# Patient Record
Sex: Female | Born: 2000
Health system: Southern US, Community
[De-identification: ages and names within clinical notes are randomized; demographics above are authoritative.]

## PROBLEM LIST (undated history)

## (undated) DIAGNOSIS — G40909 Epilepsy, unspecified, not intractable, without status epilepticus: Secondary | ICD-10-CM

## (undated) DIAGNOSIS — O169 Unspecified maternal hypertension, unspecified trimester: Secondary | ICD-10-CM

## (undated) HISTORY — PX: HAND SURGERY: SHX662

---
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
## 2018-06-13 DIAGNOSIS — H6691 Otitis media, unspecified, right ear: Secondary | ICD-10-CM | POA: Diagnosis not present

## 2018-07-01 DIAGNOSIS — Z3491 Encounter for supervision of normal pregnancy, unspecified, first trimester: Secondary | ICD-10-CM | POA: Diagnosis not present

## 2018-07-01 DIAGNOSIS — Z3490 Encounter for supervision of normal pregnancy, unspecified, unspecified trimester: Secondary | ICD-10-CM | POA: Diagnosis not present

## 2018-07-18 DIAGNOSIS — S0081XA Abrasion of other part of head, initial encounter: Secondary | ICD-10-CM | POA: Diagnosis not present

## 2018-07-18 DIAGNOSIS — S161XXA Strain of muscle, fascia and tendon at neck level, initial encounter: Secondary | ICD-10-CM | POA: Diagnosis not present

## 2018-08-11 DIAGNOSIS — Z3009 Encounter for other general counseling and advice on contraception: Secondary | ICD-10-CM | POA: Diagnosis not present

## 2018-08-11 DIAGNOSIS — Z1388 Encounter for screening for disorder due to exposure to contaminants: Secondary | ICD-10-CM | POA: Diagnosis not present

## 2018-08-11 DIAGNOSIS — Z36 Encounter for antenatal screening for chromosomal anomalies: Secondary | ICD-10-CM | POA: Diagnosis not present

## 2018-08-11 DIAGNOSIS — Z0389 Encounter for observation for other suspected diseases and conditions ruled out: Secondary | ICD-10-CM | POA: Diagnosis not present

## 2018-09-05 DIAGNOSIS — O99212 Obesity complicating pregnancy, second trimester: Secondary | ICD-10-CM | POA: Diagnosis not present

## 2018-09-05 DIAGNOSIS — Z3492 Encounter for supervision of normal pregnancy, unspecified, second trimester: Secondary | ICD-10-CM | POA: Diagnosis not present

## 2018-09-05 DIAGNOSIS — Z36 Encounter for antenatal screening for chromosomal anomalies: Secondary | ICD-10-CM | POA: Diagnosis not present

## 2018-09-05 DIAGNOSIS — Z363 Encounter for antenatal screening for malformations: Secondary | ICD-10-CM | POA: Diagnosis not present

## 2018-09-05 DIAGNOSIS — Z3A19 19 weeks gestation of pregnancy: Secondary | ICD-10-CM | POA: Diagnosis not present

## 2018-09-05 DIAGNOSIS — Z3A18 18 weeks gestation of pregnancy: Secondary | ICD-10-CM | POA: Diagnosis not present

## 2018-10-10 DIAGNOSIS — R079 Chest pain, unspecified: Secondary | ICD-10-CM | POA: Diagnosis not present

## 2018-10-10 DIAGNOSIS — O26892 Other specified pregnancy related conditions, second trimester: Secondary | ICD-10-CM | POA: Diagnosis not present

## 2018-10-10 DIAGNOSIS — Z3A24 24 weeks gestation of pregnancy: Secondary | ICD-10-CM | POA: Diagnosis not present

## 2018-10-10 DIAGNOSIS — O9989 Other specified diseases and conditions complicating pregnancy, childbirth and the puerperium: Secondary | ICD-10-CM | POA: Diagnosis not present

## 2018-10-10 DIAGNOSIS — R072 Precordial pain: Secondary | ICD-10-CM | POA: Diagnosis not present

## 2018-10-10 DIAGNOSIS — R0789 Other chest pain: Secondary | ICD-10-CM | POA: Diagnosis not present

## 2018-10-25 DIAGNOSIS — Z362 Encounter for other antenatal screening follow-up: Secondary | ICD-10-CM | POA: Diagnosis not present

## 2018-10-25 DIAGNOSIS — Z3689 Encounter for other specified antenatal screening: Secondary | ICD-10-CM | POA: Diagnosis not present

## 2018-10-25 DIAGNOSIS — Z3A26 26 weeks gestation of pregnancy: Secondary | ICD-10-CM | POA: Diagnosis not present

## 2018-10-25 DIAGNOSIS — Z0374 Encounter for suspected problem with fetal growth ruled out: Secondary | ICD-10-CM | POA: Diagnosis not present

## 2018-10-25 DIAGNOSIS — O365920 Maternal care for other known or suspected poor fetal growth, second trimester, not applicable or unspecified: Secondary | ICD-10-CM | POA: Diagnosis not present

## 2018-11-22 DIAGNOSIS — Z20828 Contact with and (suspected) exposure to other viral communicable diseases: Secondary | ICD-10-CM | POA: Diagnosis not present

## 2018-12-15 DIAGNOSIS — Z3A33 33 weeks gestation of pregnancy: Secondary | ICD-10-CM | POA: Diagnosis not present

## 2018-12-15 DIAGNOSIS — Z113 Encounter for screening for infections with a predominantly sexual mode of transmission: Secondary | ICD-10-CM | POA: Diagnosis not present

## 2018-12-15 DIAGNOSIS — Z23 Encounter for immunization: Secondary | ICD-10-CM | POA: Diagnosis not present

## 2018-12-15 DIAGNOSIS — O26843 Uterine size-date discrepancy, third trimester: Secondary | ICD-10-CM | POA: Diagnosis not present

## 2019-01-04 DIAGNOSIS — O152 Eclampsia in the puerperium: Secondary | ICD-10-CM

## 2019-01-04 DIAGNOSIS — Z8759 Personal history of other complications of pregnancy, childbirth and the puerperium: Secondary | ICD-10-CM | POA: Insufficient documentation

## 2019-01-04 DIAGNOSIS — O1490 Unspecified pre-eclampsia, unspecified trimester: Secondary | ICD-10-CM

## 2019-01-04 HISTORY — DX: Eclampsia complicating the puerperium: O15.2

## 2019-01-04 HISTORY — DX: Unspecified pre-eclampsia, unspecified trimester: O14.90

## 2019-01-24 DIAGNOSIS — Z3403 Encounter for supervision of normal first pregnancy, third trimester: Secondary | ICD-10-CM | POA: Diagnosis not present

## 2019-01-25 DIAGNOSIS — Z3A39 39 weeks gestation of pregnancy: Secondary | ICD-10-CM | POA: Diagnosis not present

## 2019-01-25 DIAGNOSIS — O134 Gestational [pregnancy-induced] hypertension without significant proteinuria, complicating childbirth: Secondary | ICD-10-CM | POA: Diagnosis not present

## 2019-01-25 DIAGNOSIS — O152 Eclampsia in the puerperium: Secondary | ICD-10-CM

## 2019-01-25 DIAGNOSIS — O1414 Severe pre-eclampsia complicating childbirth: Secondary | ICD-10-CM | POA: Diagnosis not present

## 2019-01-25 DIAGNOSIS — O1490 Unspecified pre-eclampsia, unspecified trimester: Secondary | ICD-10-CM

## 2019-01-25 DIAGNOSIS — Z8619 Personal history of other infectious and parasitic diseases: Secondary | ICD-10-CM | POA: Diagnosis not present

## 2019-01-25 DIAGNOSIS — O1413 Severe pre-eclampsia, third trimester: Secondary | ICD-10-CM | POA: Diagnosis not present

## 2019-02-02 DIAGNOSIS — R569 Unspecified convulsions: Secondary | ICD-10-CM | POA: Diagnosis not present

## 2019-02-02 DIAGNOSIS — R0689 Other abnormalities of breathing: Secondary | ICD-10-CM | POA: Diagnosis not present

## 2019-02-02 DIAGNOSIS — R000 Tachycardia, unspecified: Secondary | ICD-10-CM | POA: Diagnosis not present

## 2019-02-02 DIAGNOSIS — R4182 Altered mental status, unspecified: Secondary | ICD-10-CM | POA: Diagnosis not present

## 2019-02-02 DIAGNOSIS — O152 Eclampsia in the puerperium: Secondary | ICD-10-CM | POA: Diagnosis not present

## 2019-02-02 DIAGNOSIS — R404 Transient alteration of awareness: Secondary | ICD-10-CM | POA: Diagnosis not present

## 2019-02-03 DIAGNOSIS — O159 Eclampsia, unspecified as to time period: Secondary | ICD-10-CM | POA: Diagnosis not present

## 2019-02-04 DIAGNOSIS — O152 Eclampsia in the puerperium: Secondary | ICD-10-CM | POA: Diagnosis not present

## 2019-03-28 DIAGNOSIS — M722 Plantar fascial fibromatosis: Secondary | ICD-10-CM | POA: Diagnosis not present

## 2019-07-07 NOTE — L&D Delivery Note (Signed)
OB/GYN Faculty Practice Delivery Note  Taylor Herring is a 19 y.o. G2P1001 s/p VD at [redacted]w[redacted]d. She was admitted for IOL for Pre-E.   ROM: 0h 9m with clear fluid GBS Status: Positive; PCN given   Maximum Maternal Temperature: 98.50F  Labor Progress: . Initial SVE: 3.5/50/-2. Patient received Pitocin. Received epidural. SROM occurred. She then progressed to complete.   Delivery Date/Time: 7/5 @ 2338 Delivery: Called to room and patient was complete and pushing. Head delivered in LOA position. No nuchal cord present. Shoulder and body delivered in usual fashion. Infant with spontaneous cry, placed on mother's abdomen, dried and stimulated. Cord clamped x 2 after 1-minute delay, and cut by FOB. Cord blood drawn. Placenta delivered spontaneously with gentle cord traction. Fundus firm with massage and Pitocin. Labia, perineum, vagina, and cervix inspected inspected with a peri-urethral laceration. Discussed repair with patient as area bleeding and patient declined at this time. Pressure applied with some improvement. Advised that should she continue to have bleeding, it will likely be necessary to repair and patient agreeable. IUD placed; see separate note.  Baby Weight: pending  Placenta: Sent to L&D Complications: None Lacerations: periurethral; not repaired-see above EBL: 221 mL Analgesia: Epidural   Infant:  APGAR (1 MIN): 9   APGAR (5 MINS): 9  APGAR (10 MINS):     Jerilynn Birkenhead, MD OB Family Medicine Fellow, [facility] for [facility], [facility] 01/09/2020, 12:07 AM

## 2019-08-12 DIAGNOSIS — H1013 Acute atopic conjunctivitis, bilateral: Secondary | ICD-10-CM | POA: Diagnosis not present

## 2019-08-12 DIAGNOSIS — H40033 Anatomical narrow angle, bilateral: Secondary | ICD-10-CM | POA: Diagnosis not present

## 2019-08-20 DIAGNOSIS — H5213 Myopia, bilateral: Secondary | ICD-10-CM | POA: Diagnosis not present

## 2019-09-11 DIAGNOSIS — H1013 Acute atopic conjunctivitis, bilateral: Secondary | ICD-10-CM | POA: Diagnosis not present

## 2019-10-02 DIAGNOSIS — R1110 Vomiting, unspecified: Secondary | ICD-10-CM | POA: Diagnosis not present

## 2019-11-20 ENCOUNTER — Other Ambulatory Visit: Payer: Self-pay

## 2019-11-20 ENCOUNTER — Emergency Department (HOSPITAL_COMMUNITY): Payer: Medicaid Other

## 2019-11-20 ENCOUNTER — Encounter (HOSPITAL_COMMUNITY): Payer: Self-pay

## 2019-11-20 DIAGNOSIS — Z3490 Encounter for supervision of normal pregnancy, unspecified, unspecified trimester: Secondary | ICD-10-CM | POA: Diagnosis not present

## 2019-11-20 DIAGNOSIS — R072 Precordial pain: Secondary | ICD-10-CM | POA: Insufficient documentation

## 2019-11-20 DIAGNOSIS — Z91040 Latex allergy status: Secondary | ICD-10-CM | POA: Insufficient documentation

## 2019-11-20 DIAGNOSIS — R102 Pelvic and perineal pain: Secondary | ICD-10-CM | POA: Diagnosis not present

## 2019-11-20 DIAGNOSIS — O99513 Diseases of the respiratory system complicating pregnancy, third trimester: Secondary | ICD-10-CM | POA: Diagnosis not present

## 2019-11-20 DIAGNOSIS — R0602 Shortness of breath: Secondary | ICD-10-CM | POA: Diagnosis not present

## 2019-11-20 LAB — CBC WITH DIFFERENTIAL/PLATELET
Abs Immature Granulocytes: 0.07 10*3/uL (ref 0.00–0.07)
Basophils Absolute: 0 10*3/uL (ref 0.0–0.1)
Basophils Relative: 0 %
Eosinophils Absolute: 0.1 10*3/uL (ref 0.0–0.5)
Eosinophils Relative: 1 %
HCT: 33.4 % — ABNORMAL LOW (ref 36.0–46.0)
Hemoglobin: 9.6 g/dL — ABNORMAL LOW (ref 12.0–15.0)
Immature Granulocytes: 1 %
Lymphocytes Relative: 18 %
Lymphs Abs: 1.9 10*3/uL (ref 0.7–4.0)
MCH: 21 pg — ABNORMAL LOW (ref 26.0–34.0)
MCHC: 28.7 g/dL — ABNORMAL LOW (ref 30.0–36.0)
MCV: 72.9 fL — ABNORMAL LOW (ref 80.0–100.0)
Monocytes Absolute: 1 10*3/uL (ref 0.1–1.0)
Monocytes Relative: 9 %
Neutro Abs: 7.7 10*3/uL (ref 1.7–7.7)
Neutrophils Relative %: 71 %
Platelets: 339 10*3/uL (ref 150–400)
RBC: 4.58 MIL/uL (ref 3.87–5.11)
RDW: 16.5 % — ABNORMAL HIGH (ref 11.5–15.5)
WBC: 10.8 10*3/uL — ABNORMAL HIGH (ref 4.0–10.5)
nRBC: 0 % (ref 0.0–0.2)

## 2019-11-20 LAB — COMPREHENSIVE METABOLIC PANEL
ALT: 11 U/L (ref 0–44)
AST: 16 U/L (ref 15–41)
Albumin: 3.3 g/dL — ABNORMAL LOW (ref 3.5–5.0)
Alkaline Phosphatase: 106 U/L (ref 38–126)
Anion gap: 10 (ref 5–15)
BUN: 7 mg/dL (ref 6–20)
CO2: 21 mmol/L — ABNORMAL LOW (ref 22–32)
Calcium: 8.7 mg/dL — ABNORMAL LOW (ref 8.9–10.3)
Chloride: 104 mmol/L (ref 98–111)
Creatinine, Ser: 0.5 mg/dL (ref 0.44–1.00)
GFR calc Af Amer: >60 mL/min (ref >60)
GFR calc non Af Amer: >60 mL/min (ref >60)
Glucose, Bld: 83 mg/dL (ref 70–99)
Potassium: 4 mmol/L (ref 3.5–5.1)
Sodium: 135 mmol/L (ref 135–145)
Total Bilirubin: 0.7 mg/dL (ref 0.3–1.2)
Total Protein: 7.9 g/dL (ref 6.5–8.1)

## 2019-11-20 LAB — I-STAT BETA HCG BLOOD, ED (NOT ORDERABLE): I-stat hCG, quantitative: >2000 m[IU]/mL — ABNORMAL HIGH (ref <5)

## 2019-11-20 NOTE — ED Triage Notes (Signed)
Pt sts she is here for chest tightness and pressure while at work making her feel dizzy and nauseated. Also c/o right sided pelvic pain. Not sure how far along she is but know estimated due date 01/19/20.

## 2019-11-20 NOTE — ED Notes (Signed)
Also drew a long gold top that is in the lab

## 2019-11-21 ENCOUNTER — Emergency Department (HOSPITAL_COMMUNITY)
Admission: EM | Admit: 2019-11-21 | Discharge: 2019-11-21 | Disposition: A | Discharge status: Home / Self Care | Payer: Medicaid Other | Attending: Emergency Medicine | Admitting: Emergency Medicine

## 2019-11-21 DIAGNOSIS — R072 Precordial pain: Secondary | ICD-10-CM

## 2019-11-21 DIAGNOSIS — Z3490 Encounter for supervision of normal pregnancy, unspecified, unspecified trimester: Secondary | ICD-10-CM

## 2019-11-21 DIAGNOSIS — R0602 Shortness of breath: Secondary | ICD-10-CM

## 2019-11-21 LAB — TROPONIN I (HIGH SENSITIVITY): Troponin I (High Sensitivity): <2 ng/L (ref <18)

## 2019-11-21 NOTE — ED Notes (Signed)
Ambulated patient 1x lap around ED, patient also talked during ambulation and sats maintained greater than 93%.

## 2019-11-21 NOTE — Discharge Instructions (Signed)

## 2019-11-21 NOTE — ED Provider Notes (Signed)
[facility]-EMERGENCY DEPT Provider Note   CSN: [id number] Arrival date & time: 11/20/19  1958     History Chief Complaint  Patient presents with  . Shortness of Breath  . Pelvic Pain    Taylor Herring is a 19 y.o. female.   Shortness of Breath Severity:  Moderate Onset quality:  Sudden Duration:  2 hours Timing:  Constant Progression:  Improving Relieved by:  None tried Worsened by:  Nothing Associated symptoms: chest pain   Associated symptoms: no abdominal pain, no cough, no fever, no hemoptysis and no syncope   Associated symptoms comment:  Chest tightness Risk factors: no hx of PE/DVT and no tobacco use   Pelvic Pain Associated symptoms include chest pain and shortness of breath. Pertinent negatives include no abdominal pain.  Patient is currently [redacted] weeks pregnant.  She reports earlier tonight she had an episode of chest tightness and shortness of breath.  She reported mild nausea and dizziness that is now resolved.  No fevers or vomiting.  No hemoptysis.  No cough.  No new lower extremity edema.  This occurred at work.  She does not think she was anxious at the time.  Patient reports she has been seen previously for her pregnancy but has been quite some time.  She does report she had an ultrasound that revealed a normal pregnancy.  No acute complications thus far.  Previous history of preeclampsia from her first pregnancy She is a non-smoker.  No drug abuse   Patient reports she has had pelvic pain for over a month.  It is mostly positional.  She is pain-free at this time.  No vaginal bleeding.  She reports fetal movement.  No trauma    Past Medical History:  Diagnosis Date  . Hypertension affecting pregnancy   . Seizure disorder during pregnancy in third trimester [facility])    r/t preeclampsia    There are no problems to display for this patient.   Past Surgical History:  Procedure Laterality Date  . HAND SURGERY       OB  History    Gravida  1   Para      Term      Preterm      AB      Living        SAB      TAB      Ectopic      Multiple      Live Births              No family history on file.  Social History   Tobacco Use  . Smoking status: Never Smoker  . Smokeless tobacco: Never Used  Substance Use Topics  . Alcohol use: Not Currently  . Drug use: Not Currently    Home Medications Prior to Admission medications   Not on File    Allergies    Latex  Review of Systems   Review of Systems  Constitutional: Negative for fever.  Respiratory: Positive for shortness of breath. Negative for cough and hemoptysis.   Cardiovascular: Positive for chest pain. Negative for leg swelling and syncope.  Gastrointestinal: Negative for abdominal pain.  Genitourinary: Positive for pelvic pain. Negative for vaginal bleeding.  All other systems reviewed and are negative.   Physical Exam Updated Vital Signs BP 123/75   Pulse 86   Temp 98.4 F (36.9 C) (Oral)   Resp 20   Ht 1.791 m (5' 10.5")   Wt 104.3 kg  SpO2 97%   BMI 32.54 kg/m   Physical Exam CONSTITUTIONAL: Well developed/well nourished HEAD: Normocephalic/atraumatic EYES: EOMI/PERRL ENMT: Mucous membranes moist NECK: supple no meningeal signs SPINE/BACK:entire spine nontender CV: S1/S2 noted, no murmurs/rubs/gallops noted LUNGS: Lungs are clear to auscultation bilaterally, no apparent distress ABDOMEN: soft, nontender, no rebound or guarding, bowel sounds noted throughout abdomen, gravid GU:no cva tenderness NEURO: Pt is awake/alert/appropriate, moves all extremitiesx4.  No facial droop.  EXTREMITIES: pulses normal/equal, full ROM, no calf tenderness, no lower extremity SKIN: warm, color normal PSYCH: no abnormalities of mood noted, alert and oriented to situation  ED Results / Procedures / Treatments   Labs (all labs ordered are listed, but only abnormal results are displayed) Labs Reviewed  CBC WITH  DIFFERENTIAL/PLATELET - Abnormal; Notable for the following components:      Result Value   WBC 10.8 (*)    Hemoglobin 9.6 (*)    HCT 33.4 (*)    MCV 72.9 (*)    MCH 21.0 (*)    MCHC 28.7 (*)    RDW 16.5 (*)    All other components within normal limits  COMPREHENSIVE METABOLIC PANEL - Abnormal; Notable for the following components:   CO2 21 (*)    Calcium 8.7 (*)    Albumin 3.3 (*)    All other components within normal limits  I-STAT BETA HCG BLOOD, ED (NOT ORDERABLE) - Abnormal; Notable for the following components:   I-stat hCG, quantitative >2,000.0 (*)    All other components within normal limits  TROPONIN I (HIGH SENSITIVITY)    EKG EKG Interpretation  Date/Time:  Monday Nov 20 2019 20:39:53 EDT Ventricular Rate:  91 PR Interval:    QRS Duration: 89 QT Interval:  350 QTC Calculation: 431 R Axis:   61 Text Interpretation: Sinus rhythm Borderline T wave abnormalities Baseline wander in lead(s) II III aVF No previous ECGs available Confirmed by Ripley Fraise [phone]) on 11/21/2019 1:21:35 AM   Radiology DG Chest 2 View  Result Date: 11/20/2019 CLINICAL DATA:  Shortness of breath EXAM: CHEST - 2 VIEW COMPARISON:  None. FINDINGS: The heart size and mediastinal contours are within normal limits. Both lungs are clear. The visualized skeletal structures are unremarkable. IMPRESSION: No acute abnormality of the lungs. Electronically Signed   By: Eddie Candle M.D.   On: 11/20/2019 20:31    Procedures Procedures    Medications Ordered in ED Medications - No data to display  ED Course  I have reviewed the triage vital signs and the nursing notes.  Pertinent labs & imaging results that were available during my care of the patient were reviewed by me and considered in my medical decision making (see chart for details).    MDM Rules/Calculators/A&P                       This patient presents to the ED for concern of shortness of breath and chest pain, this involves an  extensive number of treatment options, and is a complaint that carries with it a high risk of complications and morbidity.  The differential diagnosis includes ACS, pulmonary lesion, CHF, pneumonia   Lab Tests:   I Ordered, reviewed, and interpreted labs, which included electrolytes, complete blood count, troponin    Imaging Studies ordered:   I ordered imaging studies which included chest x-ray which was ordered prior to my evaluation   I independently visualized and interpreted imaging which showed no acute findings   After the interventions stated  above, I reevaluated the patient and found patient is improved  Patient very well-appearing.  All of her symptoms are resolved.  No acute EKG changes.  Chest x-ray was negative.  Low suspicion for ACS/PE/dissection. No hypoxia or tachycardia upon ambulation which makes PE less likely.  In terms of her pelvic pain, she reports this has been chronic for over a month and is positional.  No pain at this time.  No bleeding.  She reports good fetal movement Advised follow-up with OB/GYN.  She is already had ultrasound imaging previously. Final Clinical Impression(s) / ED Diagnoses Final diagnoses:  Pregnancy, unspecified gestational age  Precordial pain  Shortness of breath    Rx / DC Orders ED Discharge Orders    None       Zadie Rhine, MD 11/21/19 0301

## 2019-11-26 DIAGNOSIS — T189XXA Foreign body of alimentary tract, part unspecified, initial encounter: Secondary | ICD-10-CM | POA: Diagnosis not present

## 2019-11-30 DIAGNOSIS — H16223 Keratoconjunctivitis sicca, not specified as Sjogren's, bilateral: Secondary | ICD-10-CM | POA: Diagnosis not present

## 2019-12-12 ENCOUNTER — Other Ambulatory Visit: Payer: Self-pay

## 2019-12-12 ENCOUNTER — Ambulatory Visit (INDEPENDENT_AMBULATORY_CARE_PROVIDER_SITE_OTHER): Payer: Medicaid Other | Admitting: Student

## 2019-12-12 ENCOUNTER — Encounter: Payer: Self-pay | Admitting: Student

## 2019-12-12 VITALS — BP 110/81 | HR 85 | Wt 247.0 lb

## 2019-12-12 DIAGNOSIS — O0993 Supervision of high risk pregnancy, unspecified, third trimester: Secondary | ICD-10-CM | POA: Diagnosis not present

## 2019-12-12 DIAGNOSIS — O152 Eclampsia in the puerperium: Secondary | ICD-10-CM | POA: Diagnosis not present

## 2019-12-12 DIAGNOSIS — Z23 Encounter for immunization: Secondary | ICD-10-CM

## 2019-12-12 DIAGNOSIS — O0933 Supervision of pregnancy with insufficient antenatal care, third trimester: Secondary | ICD-10-CM

## 2019-12-12 DIAGNOSIS — O0990 Supervision of high risk pregnancy, unspecified, unspecified trimester: Secondary | ICD-10-CM

## 2019-12-12 DIAGNOSIS — O99013 Anemia complicating pregnancy, third trimester: Secondary | ICD-10-CM

## 2019-12-12 DIAGNOSIS — O2343 Unspecified infection of urinary tract in pregnancy, third trimester: Secondary | ICD-10-CM

## 2019-12-12 DIAGNOSIS — Z8759 Personal history of other complications of pregnancy, childbirth and the puerperium: Secondary | ICD-10-CM

## 2019-12-12 DIAGNOSIS — Z3A34 34 weeks gestation of pregnancy: Secondary | ICD-10-CM | POA: Diagnosis not present

## 2019-12-12 NOTE — Progress Notes (Signed)
Gave BP Cuff today because she is unsure of going to get one or when she can. Advised on how to use it.

## 2019-12-12 NOTE — Patient Instructions (Addendum)
AREA PEDIATRIC/FAMILY PRACTICE PHYSICIANS  [place] ([address]) . [facility] o Chambliss, MD; Eniola, MD; Hale, MD; Hensel, MD; McDiarmid, MD; McIntyer, MD; Neal, MD; Walden, MD o [address] [place], [place] [address] o [phone] o Mon-Fri 8:30-12:30, 1:30-5:00 o Providers come to see babies at [facility] o Accepting Medicaid . [facility] o Limited providers who accept newborns: Koirala, MD; Morrow, MD; Wolters, MD o [address], [place], [place] [address] o [phone] o Mon-Fri 8:00-5:30 o Babies seen by providers at [facility] o Does NOT accept Medicaid o Please call early in hospitalization for appointment (limited availability)  . [facility] o Mulberry, MD o [address] [place], [place] [address] o [phone] o Mon, Tue, Thur, Fri 8:30-5:00, Wed 10:00-7:00 (closed 1-2pm) o Babies seen by [facility] providers o Accepting Medicaid . [facility] - Pediatrician o Rubin, MD o [address], [place], [place] [address] o [phone] o Mon-Fri 8:30-5:00, Sat 8:30-12:00 o Provider comes to see babies at [facility] o Accepting Medicaid o Must have been referred from current patients or contacted office prior to delivery . [facility] ([facility]) o Brown, MD; Chandler, MD; Ettefagh, MD; Grant, MD; Lester, MD; McCormick, MD; McQueen, MD; Prose, MD; Simha, MD; Stanley, MD; Stryffeler, NP; Tebben, NP o [address], [place], [place] [address] o [phone] o Mon, Tue, Thur, Fri 8:30-5:30, Wed 9:30-5:30, Sat 8:30-12:30 o Babies seen by [facility] providers o Accepting Medicaid o Only accepting infants of first-time parents or siblings of current patients o Hospital discharge coordinator will make follow-up appointment . Jack Amos o [address],  [place], [place]  [address] o [phone]   Fax - [phone] . [facility] o [address], [place], [place]  [address] o Phone - [phone]   Fax - [phone] . Shilpa Gosrani o [address], [place], [place]  [address] o [phone]  [place] ([address]) . [facility] o Bates, MD; [place], MD; Cooper, Cox, MD; MD; Davis, MD; Dovico, MD; Ettefaugh, MD; Little, MD; Lowe, MD; Keiffer, MD; Melvin, MD; Sumner, MD; Williams, MD o [address], [place], [place] [address] o [phone] o Mon-Fri 8:30-5:00 (extended evenings Mon-Thur as needed), Sat-Sun 10:00-1:00 o Providers come to see babies at [facility] o Accepting Medicaid for families of first-time babies and families with all children in the household age 3 and under. Must register with office prior to making appointment (M-F only). . [facility] o Henson, NP; Knapp, MD; Lalonde, MD; Tysinger, PA o [address] [place], [place] [address] o [phone] o Mon-Fri 8:00-5:00 o Babies seen by providers at [facility] o Does NOT accept Medicaid/Commercial Insurance Only . [facility] [place] ([facility]  o Artis, MD; Barnes, MD; Bratton, MD; Coccaro, MD; Lockett Gardner, MD; Kramer, MD; Marshall, MD; Netherton, MD; Poleto, MD; Skinner, MD o [address] [place], [place] [address] o [phone] o Mon-Fri 8:30-5:30, Sat (Oct.-Mar.) 9:00-1:00 o Babies seen by providers at [facility] o Accepting Medicaid  [place] ([address]) . [facility] o Reid, MD; Warner, MD o [address], [place],  [address] o [phone] o Mon-Fri 8:30-5:00, Sat 8:30-12:00 o Providers come to see babies at [facility] o Does NOT accept Medicaid . [facility] o Becker, PA; Hagler, MD; Scifres, PA; Sun, MD; Swayne, MD o [address],  [place], [place] [address] o [phone] o Mon-Fri 8:00-5:00 o Babies seen by providers at [facility] o Does NOT accept Medicaid o Only accepting babies of parents who are patients o Please call early in hospitalization for appointment (limited availability) . [facility] o Clark, MD; Frye, MD; Kelleher, MD; Mack, NP; Miller, MD; O'Keller, MD; Patterson, NP; Pudlo, MD; Puzio, MD; Thomas, MD; Tucker, MD; Twiselton, MD o [address], [place], [place] [address] o [phone] o Mon-Fri 8:00-5:00, Sat 9:00-12:00 o Providers come to see babies at [facility] o Does NOT accept Medicaid  [place] ([address]) . [facility] o Limited providers accepting new patients: Brake, NP; Wharton, PA o [address], [place], [place] [address] o [phone] o Mon-Fri 8:00-5:00 o Babies seen by providers at [facility] o Does NOT accept Medicaid o Only accepting babies of parents who are patients o Please call early in hospitalization for appointment (limited availability) . [facility] o Gay, MD; Quinlan, MD o [address] [place], [place] [address] o [phone] (press 1 to schedule appointment) o Mon-Fri 8:00-5:00 o Providers come to see babies at [facility] o Does NOT accept Medicaid . [facility] o Mazer, MD o [address] [place], [place] [address] o [phone] o Mon-Fri 8:30-5:00 (lunch 12:30-1:00), extended hours by appointment only Wed 5:00-6:30 o Babies seen by [facility] providers o Accepting Medicaid . [facility] o Banks, MD; Jordan, MD; Koberlein, MD o [address], [place], [place] [address] o [phone] o Mon-Fri 8:00-5:00 o Babies seen by [facility] providers o Does NOT accept Medicaid . [facility] o Parker, MD; Hunter, MD; Wallace, DO o [address] [place], [place]  [address] o [phone] o Mon-Fri 8:00-5:00 o Babies seen by [facility] providers o Does NOT accept Medicaid . [facility] o Brandon, PA; Brecken, PA; Christy, NP; Dees, MD; DeClaire, MD; DeWeese, MD; Hansen, NP; Mills, NP; Parrish, NP; Smoot, NP; Summer, MD; Vapne, MD o [address] [place], [place] [address] o [phone] o Mon-Fri 8:30-5:00, Sat 10:00-1:00 o Providers come to see babies at [facility] o Does NOT accept Medicaid o Free prenatal information session Tuesdays at 4:45pm . [facility] o Bouska, MD; Gordon, PA; Jeffery, PA; Weber, PA o [address] [place] [address] o [phone] o Mon-Fri 7:30-5:30 o Babies seen by [facility] providers . [facility] o [address], [place], [place], [place]  [address] o [phone]   Fax - [phone]  [place] ([phone]) . [facility] o [place]ese, [place] o [address] [place], [place] [address] o [phone] o Mon-Thur 8:00-6:00 o Providers come to see babies at [facility] o Accepting Medicaid . [facility] o Anderson, NP; Badger, MD; Beal, PA; Spencer, PA o [address] [place],  [address] o [phone] o Mon-Thur 7:30-7:30, Fri 7:30-4:30 o Babies seen by [facility] providers o Accepting Medicaid . [facility] o Agbuya, MD; Klett, NP; Romgoolam, MD o [address], ,  [address] o [phone] o Mon-Fri 8:30-5:00, Sat 8:30-12:00 o Providers come to see babies at [facility] o Accepting Medicaid o Must have "Meet & Greet" appointment at office prior to delivery . [facility]  ([facility] ) o McCord,   MD; Juleen China, MD; Clydene Laming, [address], [place], [place] [address] o [phone] o Mon-Wed 8:00-6:00, Thur-Fri 8:00-5:00, Sat 9:00-12:00 o Providers come to  see babies at [facility] o Does NOT accept Medicaid o Only accepting siblings of current patients . [facility] [place], [place], [place], [place]  [address] o [phone]   Fax [phone] . [facility] [place] [address], [place], [place]  [address] o [phone]   Fax - Morton Gorman [phone] & [phone]) . Therapist, music at [place], DO; [place], [address] [place], [place] [address] o [phone] o Mon-Fri 7:00-5:00 o Babies seen by [facility] providers o Does NOT accept Medicaid . [place], MD; [place], [place]; [place], [place], [place], [place] [address] o [phone] o Mon-Fri 8:00-5:00 o Babies seen by [facility] providers o Accepting Medicaid . Lamont, MD; [place], [place]; [place], NP; [place], [place], [place], [place] [address] o [phone] o Mon-Fri 8:00-5:00 o Babies seen by providers at [facility] [place] [phone]) . [facility] [place], [place] o [address] [place], [place] [address] o [phone] o Mon-Fri 8:00-5:00 o Babies seen by [facility] providers o Does NOT accept Medicaid o Limited availability, please call early in hospitalization to schedule follow-up . [facility] Leilani Merl, PA; Maisie Fus, MD; [place], MD; [place], [place]; Jeannine Kitten, MD; [place], [place] [facility] [address], [place], [place] [address] o [phone] o Mon-Fri 8:30-5:00, Sat 9:00-12:00 o Babies seen by providers at [facility] o Accepting Medicaid o Please register online then schedule online or call office o [email or web address] . [place] [facility]  [place]) Kristian Covey, NP; Dwyane Dee, MD; Leonidas Romberg, PA o [address] [place], [place], [place] [address] o [phone] o Mon-Fri 8:00-5:00 o Babies seen by providers at [facility] o Accepting Medicaid . [place] [facility] [employer]) Dairl Ponder, MD; Rayvon Char, NP; Melina Modena, MD o [address] [place], [place], [place] [address] o [phone] o Mon-Fri 8:00-5:30, Sat&Sun by appointment (phones open at 8:30) o Babies seen by [facility] providers o Accepting Medicaid o Must be a first-time baby or sibling of current patient . [place], [address], [place], [place]  [address] o [phone]   Fax - [phone]  [place] [phone] & [phone]) . [place], [place]; [place], [place]; Benjamine Mola, MD; [place], [place]; Harrell Lark, MD o [address] [place], [place] [address] o [phone] o Mon-Thur 8:00-7:00, Fri 8:00-5:00, Sat 8:00-12:00, Sun 9:00-12:00 o Babies seen by [facility] providers o Accepting Medicaid . [facility] at [facility], MD; Ruthann Cancer, MD; [facility], MD o [address], [place], [place] [address] o [phone] o Mon-Thur 8:00-5:00 o Babies seen by providers at [facility] o Accepting Medicaid . [facility] at [place], MD; Coe[place], MD; Amedeo Plenty, MD; Bobby Rumpf, MD; List, MD; Lavonia Drafts, MD; Ruthann Cancer, MD; Selinda Eon, MD; Audie Box, MD; Jim Like, MD; Christie Nottingham, MD; Hubbard Hartshorn, MD; Modena Nunnery, MD o [address] [place], [place]  [address] o [phone] o Mon-Fri 8:00-5:30, Sat (Oct.-Mar.) 9:00-1:00 o Babies seen by providers at [facility] o Accepting Medicaid o Must fill out new patient packet, available online at [email or web address] . [facility] [place] [facility] at [facility]) Simone Curia, NP; Tiburcio Pea, NP; Tresa Endo, NP; Whitney Post, MD;  [place], [place]; Hennie Duos, MD; Wynne Dust, MD; Kavin Leech, NP o [address], [place], [place] [address] o [phone] o Mon-Thur 8:00-5:30, Fri 8:00-5:00 o Babies seen by providers at [facility] o Accepting [facility] [phone]) . [facility] o [place], [place]; [place], MD; Tanya Nones, MD; [place], [place] o [address] [place], [place] [address] o [phone] o Mon-Fri 8:00-5:00 o Babies seen by providers at [facility] o Accepting [facility] [phone]) . [facility] at [facility] o [place], DO; Lenise Arena, MD; [place], [place] o [address], [place], [place] [address] o [phone] o Mon-Fri 8:00-5:00 o Babies seen by providers at [facility] o Does NOT accept Medicaid o Limited appointment availability, please call early in hospitalization  . Nature conservation officer at [facility] o [place], DO; [place], MD o [address], [place], [place] [address] o [phone] o Mon-Fri 8:00-5:00 o Babies seen by [facility] providers o Does NOT accept Medicaid . [facility] Lorrine Kin, MD; Ninetta Lights, MD; [place], [place]; Sunset, MD o [address], [place], [place] [address] o [phone] o Mon-Fri 8:00-5:00 o After hours clinic [facility] [place], [place] [address]) [phone] Mon-Fri 5:00-8:00, Sat 12:00-6:00, Sun 10:00-4:00 o Babies seen by [facility] providers o Accepting Medicaid . [facility] at [facility] o [address] [place], [place]  [address] o [phone]   Fax - [phone]  [place] [phone]) . Nature conservation officer at [facility], MD o [address] [place], [place], [place] [address] o [phone] o Mon-Fri 8:00-5:00 o Babies seen by [facility] providers o Does NOT accept Medicaid . [facility] - [place] [facility] [place]) Tomi Likens, MD o [address] [place] [address] [place], [place]  [address] o [phone] o Mon-Thur 8:00-7:00, Fri 8:00-5:00, Sat 8:00-12:00 o Babies seen by providers at [facility] o Accepting Medicaid - but does not have vaccinations in office (must be received elsewhere) o Limited availability, please call early in hospitalization  [place] ([address]) . [facility]  o Wyvonne Lenz, MD o [address], [place] [address] o [phone]  Fax [phone]    The Maternity Assessment Unit (MAU) is located at the [facility] at [facility]. The address is: [address], [place], [place], [place] [address]. Please see map below for additional directions.    The Maternity Assessment Unit is designed to help you during your pregnancy, and for up to 6 weeks after delivery, with any pregnancy- or postpartum-related emergencies, if you think you are in labor, or if your water has broken. For example, if you experience nausea and vomiting, vaginal bleeding, severe abdominal or pelvic pain, elevated blood pressure or other problems related to your pregnancy or postpartum time, please come to the Maternity Assessment Unit for assistance.    Hypertension During Pregnancy High blood pressure (hypertension) is when the force of blood pumping through the arteries is too strong. Arteries are blood vessels that carry blood from the heart throughout the body. Hypertension during pregnancy can be mild or severe. Severe  hypertension during pregnancy (preeclampsia) is a medical emergency that requires prompt evaluation and treatment. Different types of hypertension can happen during pregnancy. These include:  Chronic hypertension. This happens when you had high blood pressure before you became pregnant, and it continues during the pregnancy. Hypertension that develops before you are [redacted] weeks pregnant and continues during the pregnancy is also called chronic hypertension. If you have chronic hypertension, it will  not go away after you have your baby. You will need follow-up visits with your health care provider after you have your baby. Your doctor may want you to keep taking medicine for your blood pressure.  Gestational hypertension. This is hypertension that develops after the 20th week of pregnancy. Gestational hypertension usually goes away after you have your baby, but your health care provider will need to monitor your blood pressure to make sure that it is getting better.  Preeclampsia. This is severe hypertension during pregnancy. This can cause serious complications for you and your baby and can also cause complications for you after the delivery of your baby.  Postpartum preeclampsia. You may develop severe hypertension after giving birth. This usually occurs within 48 hours after childbirth but may occur up to 6 weeks after giving birth. This is rare. How does this affect me? Women who have hypertension during pregnancy have a greater chance of developing hypertension later in life or during future pregnancies. In some cases, hypertension during pregnancy can cause serious complications, such as:  Stroke.  Heart attack.  Injury to other organs, such as kidneys, lungs, or liver.  Preeclampsia.  Convulsions or seizures.  Placental abruption. How does this affect my baby? Hypertension during pregnancy can affect your baby. Your baby may:  Be born early (prematurely).  Not weigh as much as he or she should at birth (low birth weight).  Not tolerate labor well, leading to an unplanned cesarean delivery. What are the risks? There are certain factors that make it more likely for you to develop hypertension during pregnancy. These include:  Having hypertension during a previous pregnancy.  Being overweight.  Being age 12 or older.  Being pregnant for the first time.  Being pregnant with more than one baby.  Becoming pregnant using fertilization methods, such as IVF (in vitro  fertilization).  Having other medical problems, such as diabetes, kidney disease, or lupus.  Having a family history of hypertension. What can I do to lower my risk? The exact cause of hypertension during pregnancy is not known. You may be able to lower your risk by:  Maintaining a healthy weight.  Eating a healthy and balanced diet.  Following your health care provider's instructions about treating any long-term conditions that you had before becoming pregnant. It is very important to keep all of your prenatal care appointments. Your health care provider will check your blood pressure and make sure that your pregnancy is progressing as expected. If a problem is found, early treatment can prevent complications. How is this treated? Treatment for hypertension during pregnancy varies depending on the type of hypertension you have and how serious it is.  If you were taking medicine for high blood pressure before you became pregnant, talk with your health care provider. You may need to change medicine during pregnancy because some medicines, like ACE inhibitors, may not be considered safe for your baby.  If you have gestational hypertension, your health care provider may order medicine to treat this during pregnancy.  If you are at risk for preeclampsia, your health  care provider may recommend that you take a low-dose aspirin during your pregnancy.  If you have severe hypertension, you may need to be hospitalized so you and your baby can be monitored closely. You may also need to be given medicine to lower your blood pressure. This medicine may be given by mouth or through an IV.  In some cases, if your condition gets worse, you may need to deliver your baby early. Follow these instructions at home: Eating and drinking   Drink enough fluid to keep your urine pale yellow.  Avoid caffeine. Lifestyle  Do not use any products that contain nicotine or tobacco, such as cigarettes,  e-cigarettes, and chewing tobacco. If you need help quitting, ask your health care provider.  Do not use alcohol or drugs.  Avoid stress as much as possible.  Rest and get plenty of sleep.  Regular exercise can help to reduce your blood pressure. Ask your health care provider what kinds of exercise are best for you. General instructions  Take over-the-counter and prescription medicines only as told by your health care provider.  Keep all prenatal and follow-up visits as told by your health care provider. This is important. Contact a health care provider if:  You have symptoms that your health care provider told you may require more treatment or monitoring, such as: ? Headaches. ? Nausea or vomiting. ? Abdominal pain. ? Dizziness. ? Light-headedness. Get help right away if:  You have: ? Severe abdominal pain that does not get better with treatment. ? A severe headache that does not get better. ? Vomiting that does not get better. ? Sudden, rapid weight gain. ? Sudden swelling in your hands, ankles, or face. ? Vaginal bleeding. ? Blood in your urine. ? Blurred or double vision. ? Shortness of breath or chest pain. ? Weakness on one side of your body. ? Difficulty speaking.  Your baby is not moving as much as usual. Summary  High blood pressure (hypertension) is when the force of blood pumping through the arteries is too strong.  Hypertension during pregnancy can cause problems for you and your baby.  Treatment for hypertension during pregnancy varies depending on the type of hypertension you have and how serious it is.  Keep all prenatal and follow-up visits as told by your health care provider. This is important. This information is not intended to replace advice given to you by your health care provider. Make sure you discuss any questions you have with your health care provider. Document Revised: 10/13/2018 Document Reviewed: 07/19/2018 Elsevier Patient Education   2020 [employer].      Contraception Choices Contraception, also called birth control, refers to methods or devices that prevent pregnancy. Hormonal methods Contraceptive implant  A contraceptive implant is a thin, plastic tube that contains a hormone. It is inserted into the upper part of the arm. It can remain in place for up to 3 years. Progestin-only injections Progestin-only injections are injections of progestin, a synthetic form of the hormone progesterone. They are given every 3 months by a health care provider. Birth control pills  Birth control pills are pills that contain hormones that prevent pregnancy. They must be taken once a day, preferably at the same time each day. Birth control patch  The birth control patch contains hormones that prevent pregnancy. It is placed on the skin and must be changed once a week for three weeks and removed on the fourth week. A prescription is needed to use this method of contraception.  Vaginal ring  A vaginal ring contains hormones that prevent pregnancy. It is placed in the vagina for three weeks and removed on the fourth week. After that, the process is repeated with a new ring. A prescription is needed to use this method of contraception. Emergency contraceptive Emergency contraceptives prevent pregnancy after unprotected sex. They come in pill form and can be taken up to 5 days after sex. They work best the sooner they are taken after having sex. Most emergency contraceptives are available without a prescription. This method should not be used as your only form of birth control. Barrier methods Female condom  A female condom is a thin sheath that is worn over the penis during sex. Condoms keep sperm from going inside a woman's body. They can be used with a spermicide to increase their effectiveness. They should be disposed after a single use. Female condom  A female condom is a soft, loose-fitting sheath that is put into the vagina before  sex. The condom keeps sperm from going inside a woman's body. They should be disposed after a single use. Diaphragm  A diaphragm is a soft, dome-shaped barrier. It is inserted into the vagina before sex, along with a spermicide. The diaphragm blocks sperm from entering the uterus, and the spermicide kills sperm. A diaphragm should be left in the vagina for 6-8 hours after sex and removed within 24 hours. A diaphragm is prescribed and fitted by a health care provider. A diaphragm should be replaced every 1-2 years, after giving birth, after gaining more than 15 lb (6.8 kg), and after pelvic surgery. Cervical cap  A cervical cap is a round, soft latex or plastic cup that fits over the cervix. It is inserted into the vagina before sex, along with spermicide. It blocks sperm from entering the uterus. The cap should be left in place for 6-8 hours after sex and removed within 48 hours. A cervical cap must be prescribed and fitted by a health care provider. It should be replaced every 2 years. Sponge  A sponge is a soft, circular piece of polyurethane foam with spermicide on it. The sponge helps block sperm from entering the uterus, and the spermicide kills sperm. To use it, you make it wet and then insert it into the vagina. It should be inserted before sex, left in for at least 6 hours after sex, and removed and thrown away within 30 hours. Spermicides Spermicides are chemicals that kill or block sperm from entering the cervix and uterus. They can come as a cream, jelly, suppository, foam, or tablet. A spermicide should be inserted into the vagina with an applicator at least 10-15 minutes before sex to allow time for it to work. The process must be repeated every time you have sex. Spermicides do not require a prescription. Intrauterine contraception Intrauterine device (IUD) An IUD is a T-shaped device that is put in a woman's uterus. There are two types: Hormone IUD.This type contains progestin, a  synthetic form of the hormone progesterone. This type can stay in place for 3-5 years. Copper IUD.This type is wrapped in copper wire. It can stay in place for 10 years.  Permanent methods of contraception Female tubal ligation In this method, a woman's fallopian tubes are sealed, tied, or blocked during surgery to prevent eggs from traveling to the uterus. Hysteroscopic sterilization In this method, a small, flexible insert is placed into each fallopian tube. The inserts cause scar tissue to form in the fallopian tubes and block them,  so sperm cannot reach an egg. The procedure takes about 3 months to be effective. Another form of birth control must be used during those 3 months. Female sterilization This is a procedure to tie off the tubes that carry sperm (vasectomy). After the procedure, the man can still ejaculate fluid (semen). Natural planning methods Natural family planning In this method, a couple does not have sex on days when the woman could become pregnant. Calendar method This means keeping track of the length of each menstrual cycle, identifying the days when pregnancy can happen, and not having sex on those days. Ovulation method In this method, a couple avoids sex during ovulation. Symptothermal method This method involves not having sex during ovulation. The woman typically checks for ovulation by watching changes in her temperature and in the consistency of cervical mucus. Post-ovulation method In this method, a couple waits to have sex until after ovulation. Summary Contraception, also called birth control, means methods or devices that prevent pregnancy. Hormonal methods of contraception include implants, injections, pills, patches, vaginal rings, and emergency contraceptives. Barrier methods of contraception can include female condoms, female condoms, diaphragms, cervical caps, sponges, and spermicides. There are two types of IUDs (intrauterine devices). An IUD can be put in  a woman's uterus to prevent pregnancy for 3-5 years. Permanent sterilization can be done through a procedure for males, females, or both. Natural family planning methods involve not having sex on days when the woman could become pregnant. This information is not intended to replace advice given to you by your health care provider. Make sure you discuss any questions you have with your health care provider. Document Revised: 06/24/2017 Document Reviewed: 07/25/2016 Elsevier Patient Education  2020 [employer].

## 2019-12-12 NOTE — Progress Notes (Signed)
Subjective:   Taylor Herring is a 19 y.o. G2P1001 at 67w4dby LMP being seen today for her first obstetrical visit.  Her obstetrical history is significant for late to care, preeclampsia, & postpartum eclampsia. Patient does intend to breast feed. Pregnancy history fully reviewed. Was induced with her last pregnancy for preeclampsia. Had TSVD on 01/25/2019. Was readmitted at 1 week postpartum for eclampsia. Otherwise has no history of seizures. Records available in care everywhere; [facility]   Patient reports no complaints.  HISTORY: OB History  Gravida Para Term Preterm AB Living  _0 0 0 1  SAB TAB Ectopic Multiple Live Births  0 0 0 0 1    # Outcome Date GA Lbr Len/2nd Weight Sex Delivery Anes PTL Lv  2 Current           1 Term 01/25/19 316w5d6 lb 7.4 oz (2.93 kg) F Vag-Spont  N LIV     Complications: Preeclampsia, Postpartum eclampsia   Past Medical History:  Diagnosis Date  . Postpartum eclampsia 01/2019  . Preeclampsia 01/2019   Past Surgical History:  Procedure Laterality Date  . HAND SURGERY Left 2016   Family History  Problem Relation Age of Onset  . Diabetes Mother   . Hypertension Mother   . Bipolar disorder Mother   . Hypertension Father    Social History   Tobacco Use  . Smoking status: Never Smoker  . Smokeless tobacco: Never Used  Substance Use Topics  . Alcohol use: Not Currently  . Drug use: Not Currently   Allergies  Allergen Reactions  . Latex    No current outpatient medications on file prior to visit.   No current facility-administered medications on file prior to visit.   Exam   Vitals:   12/12/19 1543  BP: 110/81  Pulse: 85  Weight: 247 lb (112 kg)   Fetal Heart Rate (bpm): 154  Uterus:  Fundal Height: 33 cm  System: General: well-developed, well-nourished female in no acute distress   Skin: normal coloration and turgor, no rashes   Neurologic: oriented, normal, negative, normal mood    Extremities: normal strength, tone, and muscle mass, ROM of all joints is normal   HEENT PERRLA, extraocular movement intact and sclera clear, anicteric   Mouth/Teeth mucous membranes moist, pharynx normal without lesions and dental hygiene good   Neck supple and no masses   Cardiovascular: regular rate and rhythm   Respiratory:  no respiratory distress, normal breath sounds   Abdomen: soft, non-tender; bowel sounds normal; no masses,  no organomegaly     Assessment:   Pregnancy: G2P1001 Patient Active Problem List   Diagnosis Date Noted  . Supervision of high risk pregnancy, antepartum 12/12/2019  . Late prenatal care affecting pregnancy in third trimester 12/12/2019  . Preeclampsia 01/2019  . Postpartum eclampsia 01/2019     Plan:  1. Supervision of high risk pregnancy, antepartum -late to care. Labs ordered. Anatomy ultrasound scheduled.   - [place]FM OB COMP + 14 WK - Tdap vaccine greater than or equal to 7yo IM - Culture, OB Urine - Hemoglobin A1c - Protein / creatinine ratio, urine - Comp Met (CMET) - CBC/D/Plt+RPR+Rh+ABO+Rub Ab...  2. Postpartum eclampsia -too late to start BASA. BP normal today. Sent home with BP cuff to use. Reviewed signs & symptoms of preeclampsia & discussed [place]. Baseline labs collected today.   - Protein / creatinine ratio, urine - Comp Met (CMET) -  CBC/D/Plt+RPR+Rh+ABO+Rub Ab...  3. Late prenatal care affecting pregnancy in third trimester    Initial labs drawn. TDAP today Continue prenatal vitamins. Genetic Screening discussed, late to care.  Ultrasound discussed; fetal anatomic survey: ordered. Problem list reviewed and updated. The nature of [place] with multiple MDs and other Advanced Practice Providers was explained to patient; also emphasized that residents, students are part of our team. Routine obstetric precautions reviewed. Return in about 2 weeks (around 12/26/2019) for [facility], in  person, Cultures.   Jorje Guild 4:42 PM 12/12/19

## 2019-12-13 LAB — COMPREHENSIVE METABOLIC PANEL
ALT: 6 IU/L (ref 0–32)
AST: 12 IU/L (ref 0–40)
Albumin/Globulin Ratio: 0.9 — ABNORMAL LOW (ref 1.2–2.2)
Albumin: 3.2 g/dL — ABNORMAL LOW (ref 3.9–5.0)
Alkaline Phosphatase: 130 IU/L — ABNORMAL HIGH (ref 45–106)
BUN/Creatinine Ratio: 15 (ref 9–23)
BUN: 9 mg/dL (ref 6–20)
Bilirubin Total: 0.5 mg/dL (ref 0.0–1.2)
CO2: 17 mmol/L — ABNORMAL LOW (ref 20–29)
Calcium: 8.4 mg/dL — ABNORMAL LOW (ref 8.7–10.2)
Chloride: 107 mmol/L — ABNORMAL HIGH (ref 96–106)
Creatinine, Ser: 0.59 mg/dL (ref 0.57–1.00)
GFR calc Af Amer: 153 mL/min/{1.73_m2} (ref >59)
GFR calc non Af Amer: 132 mL/min/{1.73_m2} (ref >59)
Globulin, Total: 3.6 g/dL (ref 1.5–4.5)
Glucose: 78 mg/dL (ref 65–99)
Potassium: 4.4 mmol/L (ref 3.5–5.2)
Sodium: 137 mmol/L (ref 134–144)
Total Protein: 6.8 g/dL (ref 6.0–8.5)

## 2019-12-13 LAB — CBC/D/PLT+RPR+RH+ABO+RUB AB...
Antibody Screen: NEGATIVE
Basophils Absolute: 0 10*3/uL (ref 0.0–0.2)
Basos: 0 %
EOS (ABSOLUTE): 0.1 10*3/uL (ref 0.0–0.4)
Eos: 1 %
HCV Ab: <0.1 s/co ratio (ref 0.0–0.9)
HIV Screen 4th Generation wRfx: NONREACTIVE
Hematocrit: 30.3 % — ABNORMAL LOW (ref 34.0–46.6)
Hemoglobin: 9.1 g/dL — ABNORMAL LOW (ref 11.1–15.9)
Hepatitis B Surface Ag: NEGATIVE
Immature Grans (Abs): 0 10*3/uL (ref 0.0–0.1)
Immature Granulocytes: 1 %
Lymphocytes Absolute: 1.8 10*3/uL (ref 0.7–3.1)
Lymphs: 22 %
MCH: 20.4 pg — ABNORMAL LOW (ref 26.6–33.0)
MCHC: 30 g/dL — ABNORMAL LOW (ref 31.5–35.7)
MCV: 68 fL — ABNORMAL LOW (ref 79–97)
Monocytes Absolute: 0.7 10*3/uL (ref 0.1–0.9)
Monocytes: 8 %
Neutrophils Absolute: 5.7 10*3/uL (ref 1.4–7.0)
Neutrophils: 68 %
Platelets: 283 10*3/uL (ref 150–450)
RBC: 4.45 x10E6/uL (ref 3.77–5.28)
RDW: 16.4 % — ABNORMAL HIGH (ref 11.7–15.4)
RPR Ser Ql: NONREACTIVE
Rh Factor: POSITIVE
Rubella Antibodies, IGG: 1.44 index (ref >0.99)
WBC: 8.4 10*3/uL (ref 3.4–10.8)

## 2019-12-13 LAB — HCV INTERPRETATION

## 2019-12-13 LAB — PROTEIN / CREATININE RATIO, URINE
Creatinine, Urine: 199.5 mg/dL
Protein, Ur: 27.9 mg/dL
Protein/Creat Ratio: 140 mg/g creat (ref 0–200)

## 2019-12-13 LAB — HEMOGLOBIN A1C
Est. average glucose Bld gHb Est-mCnc: 111 mg/dL
Hgb A1c MFr Bld: 5.5 % (ref 4.8–5.6)

## 2019-12-15 MED ORDER — FERROUS SULFATE 325 (65 FE) MG PO TABS: 325.0000 mg | ORAL_TABLET | Freq: Two times a day (BID) | ORAL | 1 refills | Status: AC

## 2019-12-15 NOTE — Addendum Note (Signed)
Addended by: Judeth Horn B on: 12/15/2019 05:04 PM   Modules accepted: Orders

## 2019-12-17 LAB — CULTURE, OB URINE

## 2019-12-17 LAB — URINE CULTURE, OB REFLEX

## 2019-12-18 ENCOUNTER — Telehealth: Payer: Self-pay | Admitting: Lactation Services

## 2019-12-18 ENCOUNTER — Encounter: Payer: Self-pay | Admitting: Student

## 2019-12-18 DIAGNOSIS — R8271 Bacteriuria: Secondary | ICD-10-CM | POA: Insufficient documentation

## 2019-12-18 MED ORDER — CEFADROXIL 500 MG PO CAPS: 500.0000 mg | ORAL_CAPSULE | Freq: Two times a day (BID) | ORAL | 0 refills | Status: AC

## 2019-12-18 NOTE — Telephone Encounter (Signed)
-----   Message from Judeth Horn, NP sent at 12/15/2019  5:04 PM EDT ----- Regarding: 2 hr gtt Late to care, 35 wks. Please schedule for 2 hr gtt asap. thanks

## 2019-12-18 NOTE — Addendum Note (Signed)
Addended by: Judeth Horn B on: 12/18/2019 11:29 AM   Modules accepted: Orders

## 2019-12-18 NOTE — Telephone Encounter (Signed)
Called patient to let her know the front office will be calling to get her scheduled for a 2 hour gtt test. Patient did not answer. LM for patient to call the office at her convenience. My Chart message sent.   Message to front desk sent to call patient to schedule 2 hour gtt.

## 2019-12-20 ENCOUNTER — Other Ambulatory Visit: Payer: Self-pay | Admitting: *Deleted

## 2019-12-20 DIAGNOSIS — O0990 Supervision of high risk pregnancy, unspecified, unspecified trimester: Secondary | ICD-10-CM

## 2019-12-22 ENCOUNTER — Other Ambulatory Visit: Payer: Medicaid Other

## 2019-12-22 ENCOUNTER — Other Ambulatory Visit: Payer: Self-pay

## 2019-12-22 DIAGNOSIS — O0990 Supervision of high risk pregnancy, unspecified, unspecified trimester: Secondary | ICD-10-CM

## 2019-12-23 LAB — GLUCOSE TOLERANCE, 2 HOURS W/ 1HR
Glucose, 1 hour: 147 mg/dL (ref 65–179)
Glucose, 2 hour: 143 mg/dL (ref 65–152)
Glucose, Fasting: 72 mg/dL (ref 65–91)

## 2019-12-25 ENCOUNTER — Ambulatory Visit: Payer: Medicaid Other | Attending: Obstetrics and Gynecology

## 2019-12-25 ENCOUNTER — Other Ambulatory Visit: Payer: Self-pay

## 2019-12-25 DIAGNOSIS — O99013 Anemia complicating pregnancy, third trimester: Secondary | ICD-10-CM | POA: Diagnosis not present

## 2019-12-25 DIAGNOSIS — Z3A36 36 weeks gestation of pregnancy: Secondary | ICD-10-CM | POA: Diagnosis not present

## 2019-12-25 DIAGNOSIS — O09293 Supervision of pregnancy with other poor reproductive or obstetric history, third trimester: Secondary | ICD-10-CM

## 2019-12-25 DIAGNOSIS — O0933 Supervision of pregnancy with insufficient antenatal care, third trimester: Secondary | ICD-10-CM

## 2019-12-25 DIAGNOSIS — D649 Anemia, unspecified: Secondary | ICD-10-CM | POA: Diagnosis not present

## 2019-12-25 DIAGNOSIS — O0990 Supervision of high risk pregnancy, unspecified, unspecified trimester: Secondary | ICD-10-CM | POA: Insufficient documentation

## 2019-12-27 ENCOUNTER — Encounter: Payer: Medicaid Other | Admitting: Obstetrics and Gynecology

## 2019-12-27 ENCOUNTER — Telehealth: Payer: Self-pay | Admitting: Obstetrics and Gynecology

## 2019-12-27 NOTE — Telephone Encounter (Signed)
Attempted to contact patient to get her rescheduled for her missed ob appointment. No answer, left voicemail or patient to give the office a call back to be rescheduled. Mychart message sent

## 2019-12-29 ENCOUNTER — Other Ambulatory Visit: Payer: Self-pay

## 2019-12-29 ENCOUNTER — Other Ambulatory Visit (HOSPITAL_COMMUNITY)
Admission: RE | Admit: 2019-12-29 | Discharge: 2019-12-29 | Disposition: A | Discharge status: Home / Self Care | Payer: Medicaid Other | Source: Ambulatory Visit | Attending: Obstetrics & Gynecology | Admitting: Obstetrics & Gynecology

## 2019-12-29 ENCOUNTER — Ambulatory Visit (INDEPENDENT_AMBULATORY_CARE_PROVIDER_SITE_OTHER): Payer: Medicaid Other | Admitting: Obstetrics & Gynecology

## 2019-12-29 VITALS — BP 129/84 | HR 91

## 2019-12-29 DIAGNOSIS — Z3A37 37 weeks gestation of pregnancy: Secondary | ICD-10-CM | POA: Insufficient documentation

## 2019-12-29 DIAGNOSIS — Z8759 Personal history of other complications of pregnancy, childbirth and the puerperium: Secondary | ICD-10-CM | POA: Diagnosis not present

## 2019-12-29 DIAGNOSIS — O09893 Supervision of other high risk pregnancies, third trimester: Secondary | ICD-10-CM | POA: Insufficient documentation

## 2019-12-29 DIAGNOSIS — O98813 Other maternal infectious and parasitic diseases complicating pregnancy, third trimester: Secondary | ICD-10-CM | POA: Diagnosis not present

## 2019-12-29 DIAGNOSIS — O0993 Supervision of high risk pregnancy, unspecified, third trimester: Secondary | ICD-10-CM

## 2019-12-29 DIAGNOSIS — B3731 Acute candidiasis of vulva and vagina: Secondary | ICD-10-CM

## 2019-12-29 DIAGNOSIS — O99820 Streptococcus B carrier state complicating pregnancy: Secondary | ICD-10-CM

## 2019-12-29 DIAGNOSIS — O09293 Supervision of pregnancy with other poor reproductive or obstetric history, third trimester: Secondary | ICD-10-CM | POA: Insufficient documentation

## 2019-12-29 DIAGNOSIS — O0990 Supervision of high risk pregnancy, unspecified, unspecified trimester: Secondary | ICD-10-CM

## 2019-12-29 DIAGNOSIS — B373 Candidiasis of vulva and vagina: Secondary | ICD-10-CM | POA: Diagnosis not present

## 2019-12-29 DIAGNOSIS — R8271 Bacteriuria: Secondary | ICD-10-CM

## 2019-12-29 NOTE — Progress Notes (Signed)
PRENATAL VISIT NOTE  Subjective:  Taylor Herring is a 19 y.o. G2P1001 at 8753w0d being seen today for ongoing prenatal care.  She is currently monitored for the following issues for this high-risk pregnancy and has History of antepartum preeclampsia and postpartum eclampsia last pregnancy; Supervision of high risk pregnancy, antepartum; Late prenatal care affecting pregnancy in third trimester; GBS bacteriuria; and Short interval between pregnancies affecting pregnancy in third trimester, antepartum on their problem list.  Patient reports no complaints.  Contractions: Irritability. Vag. Bleeding: None.  Movement: Present. Denies leaking of fluid.   The following portions of the patient's history were reviewed and updated as appropriate: allergies, current medications, past family history, past medical history, past social history, past surgical history and problem list.   Objective:   Vitals:   12/29/19 1110  BP: 129/84  Pulse: 91    Fetal Status: Fetal Heart Rate (bpm): 153   Movement: Present     General:  Alert, oriented and cooperative. Patient is in no acute distress.  Skin: Skin is warm and dry. No rash noted.   Cardiovascular: Normal heart rate noted  Respiratory: Normal respiratory effort, no problems with respiration noted  Abdomen: Soft, gravid, appropriate for gestational age.  Pain/Pressure: Present     Pelvic: Cervical exam deferred        Extremities: Normal range of motion.  Edema: Trace  Mental Status: Normal mood and affect. Normal behavior. Normal judgment and thought content.   Imaging: US MFM OB DETAIL +14 WK  Result Date: 12/25/2019 ----------------------------------------------------------------------  OBSTETRICS REPORT                       (Signed Final 12/25/2019 01:30 pm) ---------------------------------------------------------------------- Patient Info  ID #:       [id number]                          D.O.B.:  2000-09-26 (18 yrs)  Name:       Taylor Herring           Visit Date: 12/25/2019 11:12 am ---------------------------------------------------------------------- Performed By  Attending:        Noralee Spaceavi Shankar MD        Ref. Address:     [address]                                                             [address]  Performed By:     Reinaldo RaddleBrenda Shaw            [facility]  Referred By:      Judeth HornERIN LAWRENCE                    CNM ---------------------------------------------------------------------- Orders  #  Description  Code        Ordered By  1  [place] MFM OB DETAIL +14 WK               [id number]    Judeth Horn ----------------------------------------------------------------------  #  Order #                     Accession #                Episode #  1  [id number]                   [id number]                 [id number] ---------------------------------------------------------------------- Indications  [redacted] weeks gestation of pregnancy                Z3A.36  Late to prenatal care, third trimester         O09.33  Poor obstetric history: Previous               O09.299  preeclampsia / eclampsia/gestational HTN  Anemia during pregnancy in third trimester     O99.013 ---------------------------------------------------------------------- Fetal Evaluation  Num Of Fetuses:         1  Fetal Heart Rate(bpm):  138  Cardiac Activity:       123  Presentation:           Cephalic  Placenta:               Posterior  P. Cord Insertion:      Not well visualized  Amniotic Fluid  AFI FV:      Within normal limits  AFI Sum(cm)     %Tile       Largest Pocket(cm)  17.2            64          6.7  RUQ(cm)       RLQ(cm)       LUQ(cm)        LLQ(cm)  6.7           1.5           3.6            5.4 ---------------------------------------------------------------------- Biometry  BPD:      86.8  mm     G. Age:  35w 0d         22  %    CI:        74.09   %    70 - 86                                                           FL/HC:      22.0   %    20.1 - 22.1  HC:      320.2  mm     G. Age:  36w 1d         16  %    HC/AC:      0.95        0.93 - 1.11  AC:      338.2  mm     G. Age:  37w 5d         90  %    FL/BPD:  81.0   %    71 - 87  FL:       70.3  mm     G. Age:  36w 0d         37  %    FL/AC:      20.8   %    20 - 24  HUM:      60.8  mm     G. Age:  35w 2d         44  %  CER:        46  mm     G. Age:  35w 2d         23  %  LV:        6.8  mm  CM:        8.9  mm  Est. FW:    3041  gm    6 lb 11 oz      64  % ---------------------------------------------------------------------- OB History  Gravidity:    2         Term:   1  Living:       1 ---------------------------------------------------------------------- Gestational Age  Clinical EDD:  36w 3d                                        EDD:   01/19/20  U/S Today:     36w 2d                                        EDD:   01/20/20  Best:          36w 3d     Det. By:  Clinical EDD             EDD:   01/19/20 ---------------------------------------------------------------------- Anatomy  Cranium:               Appears normal         Aortic Arch:            Not well visualized  Cavum:                 Appears normal         Ductal Arch:            Not well visualized  Ventricles:            Appears normal         Diaphragm:              Appears normal  Choroid Plexus:        Appears normal         Stomach:                Appears normal, left                                                                        sided  Cerebellum:            Appears normal  Abdomen:                Appears normal  Posterior Fossa:       Appears normal         Abdominal Wall:         Not well visualized  Nuchal Fold:           Not applicable (>20    Cord Vessels:           Appears normal ([redacted]                         wks GA)                                        vessel cord)  Face:                  Not well visualized    Kidneys:                 Appear normal  Lips:                  Appears normal         Bladder:                Appears normal  Thoracic:              Appears normal         Spine:                  Ltd views no                                                                        intracranial signs of                                                                        NTD  Heart:                 Not well visualized    Upper Extremities:      Not well visualized  RVOT:                  Not well visualized    Lower Extremities:      Visualized  LVOT:                  Not well visualized ---------------------------------------------------------------------- Cervix Uterus Adnexa  Cervix  Not visualized (advanced GA >24wks)  Uterus  No abnormality visualized.  Right Ovary  No adnexal mass visualized.  Left Ovary  No adnexal mass visualized.  Cul De Sac  No free fluid seen.  Adnexa  No adnexal mass visualized. ---------------------------------------------------------------------- Impression  G2 P1.  Late prenatal care.  Obstetric history is significant for  a term vaginal delivery in July 2020 of a female infant.  Patient reports no chronic medical conditions.  She does not  have gestational diabetes.  On ultrasound, amniotic fluid is normal and good fetal activity  seen.  Fetal growth is appropriate for gestational age.  No  obvious fetal structural defects are seen.  Fetal anatomical  survey is very limited because of advanced gestational age.  Cephalic presentation.  We reassured the patient of the findings. ---------------------------------------------------------------------- Recommendations  Follow-up scans as clinically indicated. ----------------------------------------------------------------------                  Tama High, MD Electronically Signed Final Report   12/25/2019 01:30 pm ----------------------------------------------------------------------   Assessment and Plan:  Pregnancy: G2P1001 at [redacted]w[redacted]d 1. History of  antepartum preeclampsia and postpartum eclampsia last pregnancy No symptoms, BP stable. Will continue to monitor closely.  2. GBS bacteriuria Treated with antibiotics, will still need intrapartum prophylaxis  3. [redacted] weeks gestation of pregnancy Pelvic cultures done today. - Cervicovaginal ancillary only  4. Supervision of high risk pregnancy, antepartum Labor symptoms and general obstetric precautions including but not limited to vaginal bleeding, contractions, leaking of fluid and fetal movement were reviewed in detail with the patient. Please refer to After Visit Summary for other counseling recommendations.   Return in about 1 week (around 01/05/2020) for OFFICE [facility] Visit.  No future appointments.  Verita Schneiders, MD

## 2019-12-29 NOTE — Patient Instructions (Signed)
Return to office for any scheduled appointments. Call the office or go to the MAU at [facility] if:  You begin to have strong, frequent contractions  Your water breaks.  Sometimes it is a big gush of fluid, sometimes it is just a trickle that keeps getting your panties wet or running down your legs  You have vaginal bleeding.  It is normal to have a small amount of spotting if your cervix was checked.   You do not feel your baby moving like normal.  If you do not, get something to eat and drink and lay down and focus on feeling your baby move.   If your baby is still not moving like normal, you should call the office or go to MAU.  Any other obstetric concerns.   

## 2020-01-01 LAB — CERVICOVAGINAL ANCILLARY ONLY
Bacterial Vaginitis (gardnerella): NEGATIVE
Candida Glabrata: NEGATIVE
Candida Vaginitis: POSITIVE — AB
Chlamydia: NEGATIVE
Comment: NEGATIVE
Comment: NEGATIVE
Comment: NEGATIVE
Comment: NEGATIVE
Comment: NEGATIVE
Comment: NORMAL
Neisseria Gonorrhea: NEGATIVE
Trichomonas: NEGATIVE

## 2020-01-01 LAB — OB RESULTS CONSOLE GC/CHLAMYDIA: Chlamydia: NEGATIVE

## 2020-01-02 DIAGNOSIS — H1013 Acute atopic conjunctivitis, bilateral: Secondary | ICD-10-CM | POA: Diagnosis not present

## 2020-01-02 MED ORDER — TERCONAZOLE 0.8 % VA CREA: 1.0000 | TOPICAL_CREAM | Freq: Every day | VAGINAL | 0 refills | Status: AC

## 2020-01-02 NOTE — Addendum Note (Signed)
Addended by: Jaynie Collins A on: 01/02/2020 11:31 AM   Modules accepted: Orders

## 2020-01-08 ENCOUNTER — Inpatient Hospital Stay (HOSPITAL_COMMUNITY)
Admission: AD | Admit: 2020-01-08 | Discharge: 2020-01-10 | DRG: 807 | Disposition: A | Discharge status: Home / Self Care | Payer: Medicaid Other | Attending: Obstetrics & Gynecology | Admitting: Obstetrics & Gynecology

## 2020-01-08 ENCOUNTER — Other Ambulatory Visit: Payer: Self-pay

## 2020-01-08 ENCOUNTER — Inpatient Hospital Stay (HOSPITAL_COMMUNITY): Payer: Medicaid Other | Admitting: Anesthesiology

## 2020-01-08 ENCOUNTER — Encounter (HOSPITAL_COMMUNITY): Payer: Self-pay | Admitting: Obstetrics & Gynecology

## 2020-01-08 DIAGNOSIS — O26893 Other specified pregnancy related conditions, third trimester: Secondary | ICD-10-CM | POA: Diagnosis present

## 2020-01-08 DIAGNOSIS — O1404 Mild to moderate pre-eclampsia, complicating childbirth: Secondary | ICD-10-CM | POA: Diagnosis not present

## 2020-01-08 DIAGNOSIS — O0933 Supervision of pregnancy with insufficient antenatal care, third trimester: Secondary | ICD-10-CM

## 2020-01-08 DIAGNOSIS — Z20822 Contact with and (suspected) exposure to covid-19: Secondary | ICD-10-CM | POA: Diagnosis present

## 2020-01-08 DIAGNOSIS — O0990 Supervision of high risk pregnancy, unspecified, unspecified trimester: Secondary | ICD-10-CM

## 2020-01-08 DIAGNOSIS — O09893 Supervision of other high risk pregnancies, third trimester: Secondary | ICD-10-CM

## 2020-01-08 DIAGNOSIS — Z30430 Encounter for insertion of intrauterine contraceptive device: Secondary | ICD-10-CM | POA: Diagnosis not present

## 2020-01-08 DIAGNOSIS — O1493 Unspecified pre-eclampsia, third trimester: Secondary | ICD-10-CM | POA: Diagnosis present

## 2020-01-08 DIAGNOSIS — Z3A38 38 weeks gestation of pregnancy: Secondary | ICD-10-CM | POA: Diagnosis not present

## 2020-01-08 DIAGNOSIS — O99824 Streptococcus B carrier state complicating childbirth: Secondary | ICD-10-CM | POA: Diagnosis present

## 2020-01-08 DIAGNOSIS — O164 Unspecified maternal hypertension, complicating childbirth: Secondary | ICD-10-CM | POA: Diagnosis not present

## 2020-01-08 DIAGNOSIS — Z30014 Encounter for initial prescription of intrauterine contraceptive device: Secondary | ICD-10-CM | POA: Diagnosis not present

## 2020-01-08 DIAGNOSIS — R8271 Bacteriuria: Secondary | ICD-10-CM | POA: Diagnosis present

## 2020-01-08 LAB — PROTEIN / CREATININE RATIO, URINE
Creatinine, Urine: 553.52 mg/dL
Protein Creatinine Ratio: 0.54 mg/mg{Cre} — ABNORMAL HIGH (ref 0.00–0.15)
Total Protein, Urine: 297 mg/dL

## 2020-01-08 LAB — COMPREHENSIVE METABOLIC PANEL
ALT: 10 U/L (ref 0–44)
AST: 18 U/L (ref 15–41)
Albumin: 2.5 g/dL — ABNORMAL LOW (ref 3.5–5.0)
Alkaline Phosphatase: 132 U/L — ABNORMAL HIGH (ref 38–126)
Anion gap: 11 (ref 5–15)
BUN: 8 mg/dL (ref 6–20)
CO2: 21 mmol/L — ABNORMAL LOW (ref 22–32)
Calcium: 8.4 mg/dL — ABNORMAL LOW (ref 8.9–10.3)
Chloride: 104 mmol/L (ref 98–111)
Creatinine, Ser: 0.75 mg/dL (ref 0.44–1.00)
GFR calc Af Amer: >60 mL/min (ref >60)
GFR calc non Af Amer: >60 mL/min (ref >60)
Glucose, Bld: 69 mg/dL — ABNORMAL LOW (ref 70–99)
Potassium: 3.8 mmol/L (ref 3.5–5.1)
Sodium: 136 mmol/L (ref 135–145)
Total Bilirubin: 1.3 mg/dL — ABNORMAL HIGH (ref 0.3–1.2)
Total Protein: 6.3 g/dL — ABNORMAL LOW (ref 6.5–8.1)

## 2020-01-08 LAB — SARS CORONAVIRUS 2 BY RT PCR (HOSPITAL ORDER, PERFORMED IN ~~LOC~~ HOSPITAL LAB): SARS Coronavirus 2: NEGATIVE

## 2020-01-08 LAB — ABO/RH: ABO/RH(D): B POS

## 2020-01-08 LAB — CBC
HCT: 32.5 % — ABNORMAL LOW (ref 36.0–46.0)
Hemoglobin: 9.3 g/dL — ABNORMAL LOW (ref 12.0–15.0)
MCH: 20 pg — ABNORMAL LOW (ref 26.0–34.0)
MCHC: 28.6 g/dL — ABNORMAL LOW (ref 30.0–36.0)
MCV: 69.9 fL — ABNORMAL LOW (ref 80.0–100.0)
Platelets: 240 10*3/uL (ref 150–400)
RBC: 4.65 MIL/uL (ref 3.87–5.11)
RDW: 17.7 % — ABNORMAL HIGH (ref 11.5–15.5)
WBC: 9.1 10*3/uL (ref 4.0–10.5)
nRBC: 0 % (ref 0.0–0.2)

## 2020-01-08 LAB — OB RESULTS CONSOLE GBS: GBS: POSITIVE

## 2020-01-08 LAB — TYPE AND SCREEN
ABO/RH(D): B POS
Antibody Screen: NEGATIVE

## 2020-01-08 MED ORDER — PHENYLEPHRINE 40 MCG/ML (10ML) SYRINGE FOR IV PUSH (FOR BLOOD PRESSURE SUPPORT): 80.0000 ug | PREFILLED_SYRINGE | INTRAVENOUS | Status: DC | PRN

## 2020-01-08 MED ORDER — TERBUTALINE SULFATE 1 MG/ML IJ SOLN: 0.2500 mg | Freq: Once | INTRAMUSCULAR | Status: DC | PRN

## 2020-01-08 MED ORDER — LACTATED RINGERS IV SOLN: 500.0000 mL | Freq: Once | INTRAVENOUS | Status: DC

## 2020-01-08 MED ORDER — SODIUM CHLORIDE (PF) 0.9 % IJ SOLN
INTRAMUSCULAR | Status: DC | PRN
  Administered 2020-01-08: 12 mL/h via EPIDURAL

## 2020-01-08 MED ORDER — EPHEDRINE 5 MG/ML INJ: 10.0000 mg | INTRAVENOUS | Status: DC | PRN

## 2020-01-08 MED ORDER — OXYTOCIN BOLUS FROM INFUSION
333.0000 mL | Freq: Once | INTRAVENOUS | Status: AC
  Administered 2020-01-08: 333 mL via INTRAVENOUS

## 2020-01-08 MED ORDER — LIDOCAINE HCL (PF) 1 % IJ SOLN: 30.0000 mL | INTRAMUSCULAR | Status: DC | PRN

## 2020-01-08 MED ORDER — OXYTOCIN-SODIUM CHLORIDE 30-0.9 UT/500ML-% IV SOLN: 2.5000 [IU]/h | INTRAVENOUS | Status: DC

## 2020-01-08 MED ORDER — ACETAMINOPHEN 325 MG PO TABS: 650.0000 mg | ORAL_TABLET | ORAL | Status: DC | PRN

## 2020-01-08 MED ORDER — OXYCODONE-ACETAMINOPHEN 5-325 MG PO TABS: 2.0000 | ORAL_TABLET | ORAL | Status: DC | PRN

## 2020-01-08 MED ORDER — DIPHENHYDRAMINE HCL 50 MG/ML IJ SOLN: 12.5000 mg | INTRAMUSCULAR | Status: DC | PRN

## 2020-01-08 MED ORDER — SOD CITRATE-CITRIC ACID 500-334 MG/5ML PO SOLN: 30.0000 mL | ORAL | Status: DC | PRN

## 2020-01-08 MED ORDER — ONDANSETRON HCL 4 MG/2ML IJ SOLN: 4.0000 mg | Freq: Four times a day (QID) | INTRAMUSCULAR | Status: DC | PRN

## 2020-01-08 MED ORDER — OXYCODONE-ACETAMINOPHEN 5-325 MG PO TABS: 1.0000 | ORAL_TABLET | ORAL | Status: DC | PRN

## 2020-01-08 MED ORDER — SODIUM CHLORIDE 0.9 % IV SOLN
5.0000 10*6.[IU] | Freq: Once | INTRAVENOUS | Status: AC
  Administered 2020-01-08: 5 10*6.[IU] via INTRAVENOUS
  Filled 2020-01-08: qty 5

## 2020-01-08 MED ORDER — LACTATED RINGERS IV SOLN: INTRAVENOUS | Status: DC

## 2020-01-08 MED ORDER — LIDOCAINE HCL (PF) 1 % IJ SOLN
INTRAMUSCULAR | Status: DC | PRN
  Administered 2020-01-08 (×2): 5 mL via EPIDURAL

## 2020-01-08 MED ORDER — LEVONORGESTREL 19.5 MCG/DAY IU IUD
INTRAUTERINE_SYSTEM | Freq: Once | INTRAUTERINE | Status: AC
  Administered 2020-01-08: 1 via INTRAUTERINE
  Filled 2020-01-08: qty 1

## 2020-01-08 MED ORDER — LACTATED RINGERS IV SOLN
500.0000 mL | INTRAVENOUS | Status: DC | PRN
  Administered 2020-01-08: 500 mL via INTRAVENOUS

## 2020-01-08 MED ORDER — FENTANYL-BUPIVACAINE-NACL 0.5-0.125-0.9 MG/250ML-% EP SOLN
EPIDURAL | Status: AC
  Filled 2020-01-08: qty 250

## 2020-01-08 MED ORDER — PHENYLEPHRINE 40 MCG/ML (10ML) SYRINGE FOR IV PUSH (FOR BLOOD PRESSURE SUPPORT)
PREFILLED_SYRINGE | INTRAVENOUS | Status: AC
  Filled 2020-01-08: qty 10

## 2020-01-08 MED ORDER — PENICILLIN G POT IN DEXTROSE 60000 UNIT/ML IV SOLN: 3.0000 10*6.[IU] | INTRAVENOUS | Status: DC

## 2020-01-08 MED ORDER — OXYTOCIN-SODIUM CHLORIDE 30-0.9 UT/500ML-% IV SOLN
1.0000 m[IU]/min | INTRAVENOUS | Status: DC
  Administered 2020-01-08: 2 m[IU]/min via INTRAVENOUS
  Filled 2020-01-08: qty 500

## 2020-01-08 MED ORDER — FENTANYL-BUPIVACAINE-NACL 0.5-0.125-0.9 MG/250ML-% EP SOLN: 12.0000 mL/h | EPIDURAL | Status: DC | PRN

## 2020-01-08 NOTE — Anesthesia Procedure Notes (Signed)
Epidural Patient location during procedure: OB Start time: 01/08/2020 8:22 PM End time: 01/08/2020 8:38 PM  Staffing Anesthesiologist: Heather Roberts, MD Performed: anesthesiologist   Preanesthetic Checklist Completed: patient identified, IV checked, site marked, risks and benefits discussed, monitors and equipment checked, pre-op evaluation and timeout performed  Epidural Patient position: sitting Prep: DuraPrep Patient monitoring: heart rate, cardiac monitor, continuous pulse ox and blood pressure Approach: midline Location: L2-L3 Injection technique: LOR saline  Needle:  Needle type: Tuohy  Needle gauge: 17 G Needle length: 9 cm Needle insertion depth: 8 cm Catheter size: 20 Guage Catheter at skin depth: 12 cm Test dose: negative and Other  Assessment Events: blood not aspirated, injection not painful, no injection resistance and negative IV test  Additional Notes Informed consent obtained prior to proceeding including risk of failure, 1% risk of PDPH, risk of minor discomfort and bruising.  Discussed rare but serious complications including epidural abscess, permanent nerve injury, epidural hematoma.  Discussed alternatives to epidural analgesia and patient desires to proceed.  Timeout performed pre-procedure verifying patient name, procedure, and platelet count.  Patient tolerated procedure well.

## 2020-01-08 NOTE — MAU Note (Signed)
Has been having contractions past 1.5-2hrs. About apart. Denies bleeding or leaking. No problems with preg. No recent exam.

## 2020-01-08 NOTE — H&P (Addendum)
OBSTETRIC ADMISSION HISTORY AND PHYSICAL  Taylor Herring is a 19 y.o. female G2P1001 with IUP at [redacted]w[redacted]d. Patient originally presented to [facility] for contractions and elevated BP was noted on her vitals, subsequent preeclampsia labs showed elevated protein/cr ratio and the patient has a history of preeclampsia and postpartum eclampsia. She reports +FMs. No LOF, VB, blurry vision, headaches, peripheral edema, or RUQ pain. She plans on bottle feeding. She requests IUD for birth control.  Dating: By 3rd trimester [place] --->  Estimated Date of Delivery: 01/19/20  Sono:   @[redacted]w[redacted]d , normal anatomy, cephalic presentation,  64 %ile, EFW 3041g   Prenatal History/Complications: --Late to Prenatal Care --Hx of Pre-eclampsia and postpartum eclampsia --Anemia During Pregnancy --GBS bacteriuria --Short Interval Between Pregnancies  Past Medical History: Past Medical History:  Diagnosis Date  . Postpartum eclampsia 01/2019  . Preeclampsia 01/2019    Past Surgical History: Past Surgical History:  Procedure Laterality Date  . HAND SURGERY Left 2016    Obstetrical History: OB History    Gravida  2   Para  1   Term  1   Preterm      AB      Living  1     SAB      TAB      Ectopic      Multiple      Live Births  1           Social History: Social History   Socioeconomic History  . Marital status: Significant Other    Spouse name: Not on file  . Number of children: Not on file  . Years of education: Not on file  . Highest education level: Not on file  Occupational History  . Not on file  Tobacco Use  . Smoking status: Never Smoker  . Smokeless tobacco: Never Used  Vaping Use  . Vaping Use: Never used  Substance and Sexual Activity  . Alcohol use: Not Currently  . Drug use: Not Currently  . Sexual activity: Yes  Other Topics Concern  . Not on file  Social History Narrative  . Not on file   Social Determinants of Health   Financial Resource Strain:   .  Difficulty of Paying Living Expenses:   Food Insecurity: No Food Insecurity  . Worried About 2017 in the Last Year: Never true  . Ran Out of Food in the Last Year: Never true  Transportation Needs: No Transportation Needs  . Lack of Transportation (Medical): No  . Lack of Transportation (Non-Medical): No  Physical Activity:   . Days of Exercise per Week:   . Minutes of Exercise per Session:   Stress:   . Feeling of Stress :   Social Connections:   . Frequency of Communication with Friends and Family:   . Frequency of Social Gatherings with Friends and Family:   . Attends Religious Services:   . Active Member of Clubs or Organizations:   . Attends Programme researcher, broadcasting/film/video Meetings:   Banker Marital Status:     Family History: Family History  Problem Relation Age of Onset  . Diabetes Mother   . Hypertension Mother   . Bipolar disorder Mother   . Hypertension Father     Allergies: Allergies  Allergen Reactions  . Latex     Medications Prior to Admission  Medication Sig Dispense Refill Last Dose  . ferrous sulfate (FERROUSUL) 325 (65 FE) MG tablet Take 1 tablet (325 mg total) by mouth 2 (  two) times daily. (Patient not taking: Reported on 12/29/2019) 60 tablet 1   . terconazole (TERAZOL 3) 0.8 % vaginal cream Place 1 applicator vaginally at bedtime. Apply nightly for three nights. 20 g 0      Review of Systems:  All systems reviewed and negative except as stated in HPI  PE: Blood pressure 129/90, pulse 77, temperature 98.5 F (36.9 C), temperature source Oral, resp. rate 18, height 5' 10.5" (1.791 m), weight 115.3 kg, SpO2 99 %. General appearance: alert and no distress Lungs: regular rate and effort Heart: regular rate  Abdomen: soft, non-tender Extremities: Homans sign is negative, no sign of DVT Presentation: cephalic EFM: 130 bpm, moderate variability, present accels, no decels Toco: contractions every 6-8 min. Dilation: 3.5 Effacement (%): 60 Station:  -2 Exam by:: T LYTLE RN    Prenatal labs: ABO, Rh: B/Positive/-- (06/08 1613) Antibody: Negative (06/08 1613) Rubella: 1.44 (06/08 1613) RPR: Non Reactive (06/08 1613)  HBsAg: Negative (06/08 1613)  HIV: Non Reactive (06/08 1613)  GBS:   positive 2 hr GTT: [phone]  Prenatal Transfer Tool  Maternal Diabetes: No Genetic Screening: Late to Care Maternal Ultrasounds/Referrals: Normal Fetal Ultrasounds or other Referrals:  None Maternal Substance Abuse:  No Significant Maternal Medications:  None Significant Maternal Lab Results: Group B Strep positive  Results for orders placed or performed during the hospital encounter of 01/08/20 (from the past 24 hour(s))  Protein / creatinine ratio, urine   Collection Time: 01/08/20  1:25 PM  Result Value Ref Range   Creatinine, Urine 553.52 mg/dL   Total Protein, Urine 297 mg/dL   Protein Creatinine Ratio 0.54 (H) 0.00 - 0.15 mg/mg[Cre]  CBC   Collection Time: 01/08/20  1:39 PM  Result Value Ref Range   WBC 9.1 4.0 - 10.5 K/uL   RBC 4.65 3.87 - 5.11 MIL/uL   Hemoglobin 9.3 (L) 12.0 - 15.0 g/dL   HCT 07.3 (L) 36 - 46 %   MCV 69.9 (L) 80.0 - 100.0 fL   MCH 20.0 (L) 26.0 - 34.0 pg   MCHC 28.6 (L) 30.0 - 36.0 g/dL   RDW 71.0 (H) 62.6 - 94.8 %   Platelets 240 150 - 400 K/uL   nRBC 0.0 0.0 - 0.2 %  Comprehensive metabolic panel   Collection Time: 01/08/20  1:39 PM  Result Value Ref Range   Sodium 136 135 - 145 mmol/L   Potassium 3.8 3.5 - 5.1 mmol/L   Chloride 104 98 - 111 mmol/L   CO2 21 (L) 22 - 32 mmol/L   Glucose, Bld 69 (L) 70 - 99 mg/dL   BUN 8 6 - 20 mg/dL   Creatinine, Ser 5.46 0.44 - 1.00 mg/dL   Calcium 8.4 (L) 8.9 - 10.3 mg/dL   Total Protein 6.3 (L) 6.5 - 8.1 g/dL   Albumin 2.5 (L) 3.5 - 5.0 g/dL   AST 18 15 - 41 U/L   ALT 10 0 - 44 U/L   Alkaline Phosphatase 132 (H) 38 - 126 U/L   Total Bilirubin 1.3 (H) 0.3 - 1.2 mg/dL   GFR calc non Af Amer >60 >60 mL/min   GFR calc Af Amer >60 >60 mL/min   Anion gap 11 5 -  15    Patient Active Problem List   Diagnosis Date Noted  . Short interval between pregnancies affecting pregnancy in third trimester, antepartum 12/29/2019  . GBS bacteriuria 12/18/2019  . Supervision of high risk pregnancy, antepartum 12/12/2019  . Late prenatal care affecting  pregnancy in third trimester 12/12/2019  . History of antepartum preeclampsia and postpartum eclampsia last pregnancy 01/2019    Assessment: Taylor Herring is a 19 y.o. G2P1001 at [redacted]w[redacted]d here for IOL for preeclampsia with a history of prior preeclampsia with severe features.  1. Labor: IOL with pitocin 2. FWB: Cat 1  3. Pain: Epidural available when requested 4. GBS: Positive, PCN 5. Late to Prenatal Care 6. Hx of preeclampsia and postpartum eclampsia   Plan: Admit to labor and delivery for induction of labor with pitocin.  --PEC: continue to monitor BP --GBS Bacteriuria: PCN   Evelena Leyden, DO  PGY 1, Family Medicine 01/08/2020, 4:42 PM  Attestation of Supervision of Resident:  I confirm that I have verified the information documented in the resident's note and that I have also personally reperformed the history, physical exam and all medical decision making activities.  I have verified that all services and findings are accurately documented in this student's note; and I agree with management and plan as outlined in the documentation. I have also made any necessary editorial changes.  Sharyon Cable, CNM Center for [employer], [facility] 01/08/2020 5:50 PM

## 2020-01-08 NOTE — Progress Notes (Signed)
Labor Progress Note Analyce Elvy Mclarty is a 19 y.o. G2P1001 at 43w3dpresented for IOL for Pre-E. S: Getting more comfortable with epidural. Met patient and discussed plan.   O:  BP (!) 139/99   Pulse 77   Temp 98.2 F (36.8 C) (Oral)   Resp (P) 18   Ht 5' 10.5" (1.791 m)   Wt 115.3 kg   SpO2 99%   BMI 35.96 kg/m  EFM: 135, moderate variability, pos accels, no decels, reactive TOCO: q3-415mCVE: Dilation: 3.5 Effacement (%): 60 Station: -2 Presentation: Vertex Exam by:: s grindstaff rn    A&P: 20104.o. G2P1001 3882w3dre for IOL for Pre-E. #Labor: Progressing well. Cont Pitocin. Will hold on AROM until adequate GBS ppx. Anticipate SVD. #Pain: epidural #FWB: Cat I; EFW 3700g #GBS positive; PCN #Pre-E: Pr/Cr 0.54, CMP WNL. Asymptomatic. Mild-range pressures.  CheChauncey MannD 9:02 PM

## 2020-01-08 NOTE — Anesthesia Preprocedure Evaluation (Signed)
Anesthesia Evaluation  Patient identified by MRN, date of birth, ID band Patient awake    Reviewed: Allergy & Precautions, NPO status , Patient's Chart, lab work & pertinent test results  Airway Mallampati: II  TM Distance: >3 FB Neck ROM: Full    Dental no notable dental hx. (+) Dental Advisory Given   Pulmonary neg pulmonary ROS,    Pulmonary exam normal        Cardiovascular hypertension, negative cardio ROS Normal cardiovascular exam     Neuro/Psych negative neurological ROS  negative psych ROS   GI/Hepatic negative GI ROS, Neg liver ROS,   Endo/Other  negative endocrine ROS  Renal/GU negative Renal ROS  negative genitourinary   Musculoskeletal negative musculoskeletal ROS (+)   Abdominal   Peds negative pediatric ROS (+)  Hematology negative hematology ROS (+)   Anesthesia Other Findings   Reproductive/Obstetrics (+) Pregnancy                             Anesthesia Physical Anesthesia Plan  ASA: II  Anesthesia Plan:    Post-op Pain Management:    Induction:   PONV Risk Score and Plan:   Airway Management Planned: Natural Airway  Additional Equipment:   Intra-op Plan:   Post-operative Plan:   Informed Consent: I have reviewed the patients History and Physical, chart, labs and discussed the procedure including the risks, benefits and alternatives for the proposed anesthesia with the patient or authorized representative who has indicated his/her understanding and acceptance.     Dental advisory given  Plan Discussed with: Anesthesiologist  Anesthesia Plan Comments:         Anesthesia Quick Evaluation

## 2020-01-09 ENCOUNTER — Encounter (HOSPITAL_COMMUNITY): Payer: Self-pay | Admitting: Obstetrics & Gynecology

## 2020-01-09 ENCOUNTER — Encounter: Payer: Medicaid Other | Admitting: Nurse Practitioner

## 2020-01-09 DIAGNOSIS — O99824 Streptococcus B carrier state complicating childbirth: Secondary | ICD-10-CM

## 2020-01-09 DIAGNOSIS — Z30014 Encounter for initial prescription of intrauterine contraceptive device: Secondary | ICD-10-CM

## 2020-01-09 DIAGNOSIS — O1404 Mild to moderate pre-eclampsia, complicating childbirth: Secondary | ICD-10-CM

## 2020-01-09 DIAGNOSIS — Z3A38 38 weeks gestation of pregnancy: Secondary | ICD-10-CM

## 2020-01-09 LAB — RPR: RPR Ser Ql: NONREACTIVE

## 2020-01-09 MED ORDER — IBUPROFEN 600 MG PO TABS
600.0000 mg | ORAL_TABLET | Freq: Three times a day (TID) | ORAL | Status: DC | PRN
  Administered 2020-01-09 – 2020-01-10 (×5): 600 mg via ORAL
  Filled 2020-01-09 (×6): qty 1

## 2020-01-09 MED ORDER — DIBUCAINE (PERIANAL) 1 % EX OINT
1.0000 "application " | TOPICAL_OINTMENT | CUTANEOUS | Status: DC | PRN
  Administered 2020-01-09: 1 via RECTAL
  Filled 2020-01-09: qty 28

## 2020-01-09 MED ORDER — BENZOCAINE-MENTHOL 20-0.5 % EX AERO: 1.0000 "application " | INHALATION_SPRAY | CUTANEOUS | Status: DC | PRN

## 2020-01-09 MED ORDER — DIPHENHYDRAMINE HCL 25 MG PO CAPS: 25.0000 mg | ORAL_CAPSULE | Freq: Four times a day (QID) | ORAL | Status: DC | PRN

## 2020-01-09 MED ORDER — ONDANSETRON HCL 4 MG/2ML IJ SOLN: 4.0000 mg | INTRAMUSCULAR | Status: DC | PRN

## 2020-01-09 MED ORDER — COCONUT OIL OIL: 1.0000 "application " | TOPICAL_OIL | Status: DC | PRN

## 2020-01-09 MED ORDER — SENNOSIDES-DOCUSATE SODIUM 8.6-50 MG PO TABS
2.0000 | ORAL_TABLET | ORAL | Status: DC
  Administered 2020-01-09: 2 via ORAL
  Filled 2020-01-09: qty 2

## 2020-01-09 MED ORDER — FERROUS SULFATE 325 (65 FE) MG PO TABS
325.0000 mg | ORAL_TABLET | Freq: Every day | ORAL | Status: DC
  Administered 2020-01-09 – 2020-01-10 (×2): 325 mg via ORAL
  Filled 2020-01-09 (×2): qty 1

## 2020-01-09 MED ORDER — TETANUS-DIPHTH-ACELL PERTUSSIS 5-2.5-18.5 LF-MCG/0.5 IM SUSP: 0.5000 mL | Freq: Once | INTRAMUSCULAR | Status: DC

## 2020-01-09 MED ORDER — MEASLES, MUMPS & RUBELLA VAC IJ SOLR: 0.5000 mL | Freq: Once | INTRAMUSCULAR | Status: DC

## 2020-01-09 MED ORDER — SIMETHICONE 80 MG PO CHEW: 80.0000 mg | CHEWABLE_TABLET | ORAL | Status: DC | PRN

## 2020-01-09 MED ORDER — WITCH HAZEL-GLYCERIN EX PADS
1.0000 "application " | MEDICATED_PAD | CUTANEOUS | Status: DC | PRN
  Administered 2020-01-09: 1 via TOPICAL

## 2020-01-09 MED ORDER — PRENATAL MULTIVITAMIN CH
1.0000 | ORAL_TABLET | Freq: Every day | ORAL | Status: DC
  Administered 2020-01-09 – 2020-01-10 (×2): 1 via ORAL
  Filled 2020-01-09 (×2): qty 1

## 2020-01-09 MED ORDER — AMLODIPINE BESYLATE 5 MG PO TABS
5.0000 mg | ORAL_TABLET | Freq: Every day | ORAL | Status: DC
  Administered 2020-01-09 – 2020-01-10 (×2): 5 mg via ORAL
  Filled 2020-01-09 (×2): qty 1

## 2020-01-09 MED ORDER — ONDANSETRON HCL 4 MG PO TABS: 4.0000 mg | ORAL_TABLET | ORAL | Status: DC | PRN

## 2020-01-09 MED ORDER — ACETAMINOPHEN 325 MG PO TABS
650.0000 mg | ORAL_TABLET | Freq: Four times a day (QID) | ORAL | Status: DC | PRN
  Administered 2020-01-09: 650 mg via ORAL
  Filled 2020-01-09: qty 2

## 2020-01-09 NOTE — Clinical Social Work Maternal (Signed)
CLINICAL SOCIAL WORK MATERNAL/CHILD NOTE  Patient Details  Name: Taylor Herring MRN: [id number] Date of Birth: 01/08/2022  Date:  01/09/2020  Clinical Social Worker Initiating Note:  Lear Ng Date/Time: Initiated:  01/09/20/1034     Child's Name:  Taylor Herring   Biological Parents:  Mother, Father Nolon Stalls DOB: 11/13/1997)   Need for Interpreter:  None   Reason for Referral:  Late or No Prenatal Care    Address:  [address] [place] [address]    Phone number:  [phone] (home)     Additional phone number:   Household Members/Support Persons (HM/SP):   Household Member/Support Person 1, Household Member/Support Person 2   HM/SP Name Relationship DOB or Age  HM/SP -1 Nolon Stalls FOB 11/13/1997  HM/SP -2 Cheryll Dessert Daughter 01/25/2019  HM/SP -3        HM/SP -4        HM/SP -5        HM/SP -6        HM/SP -7        HM/SP -8          Natural Supports (not living in the home):  Immediate Family, Spouse/significant other   Professional Supports: None   Employment: Full-time   Type of Work: Hospital doctor   Education:  Some Materials engineer arranged:    Surveyor, quantity Resources:  Medicaid   Other Resources:  Sales executive , WIC   Cultural/Religious Considerations Which May Impact Care:    Strengths:  Ability to meet basic needs , Home prepared for child , Pediatrician chosen   Psychotropic Medications:         Pediatrician:       Pediatrician List:   Radiographer, therapeutic    [place]    [place]      Pediatrician Fax Number:    Risk Factors/Current Problems:  None   Cognitive State:  Able to Concentrate , Alert , Linear Thinking    Mood/Affect:  Calm , Interested , Happy    CSW Assessment:  CSW received consult for [facility] at 34 weeks. CSW met with MOB to offer support and complete assessment.    MOB resting in bed holding infant skin to  skin, when CSW entered the room. FOB present but left shortly after CSW's arrival. CSW introduced self and explained reason for consult to which MOB expressed understanding. MOB very pleasant and easy to engage throughout visit and was observed to be attentive to infant's cues. Per MOB, she currently lives with FOB and her other daughter and works full-time for [employer]. MOB confirmed she receives both [employer] and food stamps and is aware she would need to update them both of her delivery to get infant added to plans. CSW inquired about MOB's mental health history to which MOB denied having any and denied any recent or current mental health concerns. CSW provided education regarding the baby blues period vs. perinatal mood disorders. CSW recommended self-evaluation during the postpartum time period using the New Mom Checklist from Postpartum Progress and encouraged MOB to contact a medical professional if symptoms are noted at any time. MOB denied any current SI, HI or DV and reported having good support from FOB and her two sisters. MOB confirmed having all essential items for infant once discharged and stated infant would be sleeping in a bassinet once home. CSW provided review of  Sudden Infant Death Syndrome (SIDS) precautions and safe sleeping habits.  CSW inquired about reasoning for MOB's late prenatal care. Per MOB, she had difficulty finding a doctor and was in the process of moving from [place] to [place]. Per MOB, she had begun looking for a doctor early but could not find one who was taking Medicaid as most were full. CSW informed MOB of Hospital Drug Policy and explained UDS came back negative but that CDS was still pending and a CPS report would be made, if warranted. MOB denied any questions or concerns regarding policy. MOB denied any substance use during pregnancy.  CSW to continue to monitor CDS and make [facility] CPS report, if warranted.    CSW Plan/Description:  No  Further Intervention Required/No Barriers to Discharge, Sudden Infant Death Syndrome (SIDS) Education, Perinatal Mood and Anxiety Disorder (PMADs) Education, [place], CSW Will Continue to Monitor Umbilical Cord Tissue Drug Screen Results and Make Report if [facility], LCSW 01/09/2020, 11:14 AM

## 2020-01-09 NOTE — Progress Notes (Signed)
Post Partum Day 1 Subjective: Patient reports feeling well. She is tolerating PO. She has been up to urinate but not ambulated much yet. Lochia minimal.  Objective: Blood pressure 118/72, pulse 68, temperature 98.9 F (37.2 C), temperature source Oral, resp. rate 16, height 5' 10.5" (1.791 m), weight 115.3 kg, SpO2 99 %, unknown if currently breastfeeding.  Physical Exam:  General: alert, cooperative, appears stated age and no distress Lochia: appropriate Uterine Fundus: firm Incision: NA DVT Evaluation: No evidence of DVT seen on physical exam.  Recent Labs    01/08/20 1339  HGB 9.3*  HCT 32.5*    Assessment/Plan: Plan for discharge tomorrow  Vitals stable Mild Pre-E with history of Postpartum eclampsia: Norvasc 5 mg initiated; BP check requested in 1 week Formula Feeding IUD in place   LOS: 1 day   Dimitri Shakespeare N Luci Bellucci 01/09/2020, 5:29 AM

## 2020-01-09 NOTE — Discharge Summary (Signed)
Postpartum Discharge Summary     Patient Name: Taylor Herring DOB: 05/14/2001 MRN: [id number]  Date of admission: 01/08/2020 Delivery date:01/08/2020  Delivering provider: Chauncey Mann  Date of discharge: 01/10/2020  Admitting diagnosis: Preeclampsia, third trimester [O14.93] Intrauterine pregnancy: [redacted]w[redacted]d    Secondary diagnosis:  Active Problems:   Late prenatal care affecting pregnancy in third trimester   GBS bacteriuria   Short interval between pregnancies affecting pregnancy in third trimester, antepartum   Preeclampsia, third trimester  Additional problems: None    Discharge diagnosis: Term Pregnancy Delivered and Preeclampsia (mild)                                              Post partum procedures:IUD placed after delivery of placenta Augmentation: Pitocin Complications: None  Hospital course: Induction of Labor With Vaginal Delivery   19y.o. yo G2P1001 at 35w4das admitted to the hospital 01/08/2020 for induction of labor.  Indication for induction: Preeclampsia.  Patient had an uncomplicated labor course as follows: Initial SVE: 3.5/50/-2. Patient received Pitocin. Received epidural. SROM occurred. She then progressed to complete.  Membrane Rupture Time/Date: 11:27 PM ,01/08/2020   Delivery Method:Vaginal, Spontaneous  Episiotomy: None  Lacerations:  Periurethral  Details of delivery can be found in separate delivery note. Norvasc initiated post-partum due to history of post-partum eclampsia and borderline BP's. BP's monitored post-partum and Norvasc 5 mg initiated and prescribed on discharge. Patient had a routine postpartum course. Patient is discharged home 01/10/20.  Newborn Data: Birth date:01/08/2020  Birth time:11:38 PM  Gender:Female  Living status:Living  Apgars:9 ,9  Weight:3206 g   Magnesium Sulfate received: No BMZ received: No Rhophylac:N/A MMR:N/A T-DaP:Given prenatally Flu: No Transfusion:No  Physical exam  Vitals:   01/09/20 0958  01/09/20 1512 01/09/20 1948 01/10/20 0529  BP: (!) 129/98 (!) 136/94 128/87 (!) 135/99  Pulse: 63 63 79 63  Resp: _0 Temp: 98.5 F (36.9 C) 98.8 F (37.1 C) 98.3 F (36.8 C) 98.3 F (36.8 C)  TempSrc: Oral Oral Oral Oral  SpO2: 100% 99% 98% 99%  Weight:      Height:       General: alert, cooperative and no distress Lochia: appropriate Uterine Fundus: firm Incision: N/A DVT Evaluation: No evidence of DVT seen on physical exam. Labs: Lab Results  Component Value Date   WBC 9.1 01/08/2020   HGB 9.3 (L) 01/08/2020   HCT 32.5 (L) 01/08/2020   MCV 69.9 (L) 01/08/2020   PLT 240 01/08/2020   CMP Latest Ref Rng & Units 01/08/2020  Glucose 70 - 99 mg/dL 69(L)  BUN 6 - 20 mg/dL 8  Creatinine 0.44 - 1.00 mg/dL 0.75  Sodium 135 - 145 mmol/L 136  Potassium 3.5 - 5.1 mmol/L 3.8  Chloride 98 - 111 mmol/L 104  CO2 22 - 32 mmol/L 21(L)  Calcium 8.9 - 10.3 mg/dL 8.4(L)  Total Protein 6.5 - 8.1 g/dL 6.3(L)  Total Bilirubin 0.3 - 1.2 mg/dL 1.3(H)  Alkaline Phos 38 - 126 U/L 132(H)  AST 15 - 41 U/L 18  ALT 0 - 44 U/L 10   Edinburgh Score: Edinburgh Postnatal Depression Scale Screening Tool 01/09/2020  I have been able to laugh and see the funny side of things. 0  I have looked forward with enjoyment to things. 0  I have blamed myself unnecessarily  when things went wrong. 1  I have been anxious or worried for no good reason. 2  I have felt scared or panicky for no good reason. 0  Things have been getting on top of me. 1  I have been so unhappy that I have had difficulty sleeping. 1  I have felt sad or miserable. 0  I have been so unhappy that I have been crying. 0  The thought of harming myself has occurred to me. 0  Edinburgh Postnatal Depression Scale Total 5     After visit meds:  Allergies as of 01/10/2020      Reactions   Latex       Medication List    TAKE these medications   acetaminophen 325 MG tablet Commonly known as: Tylenol Take 2 tablets (650 mg total)  by mouth every 6 (six) hours as needed (for pain scale < 4).   amLODipine 5 MG tablet Commonly known as: NORVASC Take 1 tablet (5 mg total) by mouth daily.   ferrous sulfate 325 (65 FE) MG tablet Commonly known as: FerrouSul Take 1 tablet (325 mg total) by mouth 2 (two) times daily.   ibuprofen 600 MG tablet Commonly known as: ADVIL Take 1 tablet (600 mg total) by mouth every 8 (eight) hours as needed for mild pain.   senna-docusate 8.6-50 MG tablet Commonly known as: Senokot-S Take 2 tablets by mouth daily. Start taking on: January 11, 2020   terconazole 0.8 % vaginal cream Commonly known as: TERAZOL 3 Place 1 applicator vaginally at bedtime. Apply nightly for three nights.        Discharge home in stable condition Infant Feeding: Bottle Infant Disposition:home with mother Discharge instruction: per After Visit Summary and Postpartum booklet. Activity: Advance as tolerated. Pelvic rest for 6 weeks.  Diet: routine diet Future Appointments: Future Appointments  Date Time Provider [place]  01/16/2020  9:00 AM [facility] NURSE [facility]  02/06/2020 10:15 AM Starr [place], CNM [facility] : [facility]   Follow up Visit:  [place] for [facility]. Go to.   Specialty: Obstetrics and Gynecology Why: As scheduled 01/09/2020 Contact information: [place] [id number] [phone]               Please schedule this patient for a In person postpartum visit in 4 weeks with the following provider: Any provider. Additional Postpartum F/U:BP check 1 week and IUD string check at 4-6 weeks  Low risk pregnancy complicated by: HTN Delivery mode:  Vaginal, Spontaneous  Anticipated Birth Control:  IUD   01/10/2020 Chauncey Mann, MD

## 2020-01-09 NOTE — Anesthesia Postprocedure Evaluation (Signed)
Anesthesia Post Note  Patient: Taylor Herring  Procedure(s) Performed: AN AD HOC LABOR EPIDURAL     Patient location during evaluation: Mother Baby Anesthesia Type: Epidural Level of consciousness: awake and alert and oriented Pain management: satisfactory to patient Vital Signs Assessment: post-procedure vital signs reviewed and stable Respiratory status: respiratory function stable Cardiovascular status: stable Postop Assessment: no headache, no backache, epidural receding, patient able to bend at knees, no signs of nausea or vomiting, adequate PO intake and able to ambulate Anesthetic complications: no   No complications documented.  Last Vitals:  Vitals:   01/09/20 0620 01/09/20 0635  BP: (!) 133/97 (!) 142/93  Pulse: 70 63  Resp: 18   Temp: 37 C   SpO2: 99%     Last Pain:  Vitals:   01/09/20 0620  TempSrc: Oral  PainSc:    Pain Goal: Patients Stated Pain Goal: 1 (01/09/20 0215)                 Karleen Dolphin

## 2020-01-09 NOTE — Procedures (Signed)
Patient desired post-placental IUD insertion.  IUD Insertion Procedure Note Patient identified, informed consent performed, consent signed.   Discussed risks of irregular bleeding, cramping, infection, malpositioning or misplacement of the IUD outside the uterus which may require further procedure such as laparoscopy. Discussed higher risk of expulsion in post-partum period. Also discussed >99% contraception efficacy, increased risk of ectopic pregnancy with failure of method.  Time out was performed.   Liletta IUD removed from insertor and placed manually to fundus. Strings trimmed to 3 cm from cervix. Patient tolerated procedure well.   Patient was given post-procedure instructions. She will follow-up at post-partum visit.   LOT #: [id number] EXP: 06/2023  Jerilynn Birkenhead, MD OB Family Medicine Fellow, [facility] for [employer], [facility]

## 2020-01-10 MED ORDER — SENNOSIDES-DOCUSATE SODIUM 8.6-50 MG PO TABS: 2.0000 | ORAL_TABLET | ORAL | 0 refills | Status: AC

## 2020-01-10 MED ORDER — ACETAMINOPHEN 325 MG PO TABS: 650.0000 mg | ORAL_TABLET | Freq: Four times a day (QID) | ORAL | 0 refills | Status: AC | PRN

## 2020-01-10 MED ORDER — IBUPROFEN 600 MG PO TABS: 600.0000 mg | ORAL_TABLET | Freq: Three times a day (TID) | ORAL | 0 refills | Status: AC | PRN

## 2020-01-10 MED ORDER — AMLODIPINE BESYLATE 5 MG PO TABS: 5.0000 mg | ORAL_TABLET | Freq: Every day | ORAL | 0 refills | Status: DC

## 2020-01-10 MED ORDER — AMLODIPINE BESYLATE 5 MG PO TABS: 5.0000 mg | ORAL_TABLET | Freq: Every day | ORAL | 0 refills | Status: AC

## 2020-01-10 MED ORDER — IBUPROFEN 600 MG PO TABS: 600.0000 mg | ORAL_TABLET | Freq: Three times a day (TID) | ORAL | 0 refills | Status: DC | PRN

## 2020-01-10 MED FILL — AMLODIPINE BESYLATE 5 MG TA: 5 | 60 days supply | Qty: 60 | Fill #0

## 2020-01-10 MED FILL — IBUPROFEN 600 MG TABLET: 600 | 10 days supply | Qty: 30 | Fill #0

## 2020-01-10 NOTE — Discharge Instructions (Signed)
Hypertension During Pregnancy Hypertension is also called high blood pressure. High blood pressure means that the force of your blood moving in your body is too strong. It can cause problems for you and your baby. Different types of high blood pressure can happen during pregnancy. The types are:  High blood pressure before you got pregnant. This is called chronic hypertension.  This can continue during your pregnancy. Your doctor will want to keep checking your blood pressure. You may need medicine to keep your blood pressure under control while you are pregnant. You will need follow-up visits after you have your baby.  High blood pressure that goes up during pregnancy when it was normal before. This is called gestational hypertension. It will usually get better after you have your baby, but your doctor will need to watch your blood pressure to make sure that it is getting better.  Very high blood pressure during pregnancy. This is called preeclampsia. Very high blood pressure is an emergency that needs to be checked and treated right away.  You may develop very high blood pressure after giving birth. This is called postpartum preeclampsia. This usually occurs within 48 hours after childbirth but may occur up to 6 weeks after giving birth. This is rare. How does this affect me? If you have high blood pressure during pregnancy, you have a higher chance of developing high blood pressure:  As you get older.  If you get pregnant again. In some cases, high blood pressure during pregnancy can cause:  Stroke.  Heart attack.  Damage to the kidneys, lungs, or liver.  Preeclampsia.  Jerky movements you cannot control (convulsions or seizures).  Problems with the placenta. How does this affect my baby? Your baby may:  Be born early.  Not weigh as much as he or she should.  Not handle labor well, leading to a c-section birth. What are the risks?  Having high blood pressure during a past  pregnancy.  Being overweight.  Being 35 years old or older.  Being pregnant for the first time.  Being pregnant with more than one baby.  Becoming pregnant using fertility methods, such as IVF.  Having other problems, such as diabetes, or kidney disease.  Having family members who have high blood pressure. What can I do to lower my risk?   Keep a healthy weight.  Eat a healthy diet.  Follow what your doctor tells you about treating any medical problems that you had before becoming pregnant. It is very important to go to all of your doctor visits. Your doctor will check your blood pressure and make sure that your pregnancy is progressing as it should. Treatment should start early if a problem is found. How is this treated? Treatment for high blood pressure during pregnancy can differ depending on the type of high blood pressure you have and how serious it is.  You may need to take blood pressure medicine.  If you have been taking medicine for your blood pressure, you may need to change the medicine during pregnancy if it is not safe for your baby.  If your doctor thinks that you could get very high blood pressure, he or she may tell you to take a low-dose aspirin during your pregnancy.  If you have very high blood pressure, you may need to stay in the hospital so you and your baby can be watched closely. You may also need to take medicine to lower your blood pressure. This medicine may be given by mouth   or through an IV tube.  In some cases, if your condition gets worse, you may need to have your baby early. Follow these instructions at home: Eating and drinking   Drink enough fluid to keep your pee (urine) pale yellow.  Avoid caffeine. Lifestyle  Do not use any products that contain nicotine or tobacco, such as cigarettes, e-cigarettes, and chewing tobacco. If you need help quitting, ask your doctor.  Do not use alcohol or drugs.  Avoid stress.  Rest and get plenty  of sleep.  Regular exercise can help. Ask your doctor what kinds of exercise are best for you. General instructions  Take over-the-counter and prescription medicines only as told by your doctor.  Keep all prenatal and follow-up visits as told by your doctor. This is important. Contact a doctor if:  You have symptoms that your doctor told you to watch for, such as: ? Headaches. ? Nausea. ? Vomiting. ? Belly (abdominal) pain. ? Dizziness. ? Light-headedness. Get help right away if:  You have: ? Very bad belly pain that does not get better with treatment. ? A very bad headache that does not get better. ? Vomiting that does not get better. ? Sudden, fast weight gain. ? Sudden swelling in your hands, ankles, or face. ? Bleeding from your vagina. ? Blood in your pee. ? Blurry vision. ? Double vision. ? Shortness of breath. ? Chest pain. ? Weakness on one side of your body. ? Trouble talking.  Your baby is not moving as much as usual. Summary  High blood pressure is also called hypertension.  High blood pressure means that the force of your blood moving in your body is too strong.  High blood pressure can cause problems for you and your baby.  Keep all follow-up visits as told by your doctor. This is important. This information is not intended to replace advice given to you by your health care provider. Make sure you discuss any questions you have with your health care provider. Document Revised: 10/13/2018 Document Reviewed: 07/19/2018 [employer] Patient Education  2020 [employer] Postpartum Care After Vaginal Delivery This sheet gives you information about how to care for yourself from the time you deliver your baby to up to 6-12 weeks after delivery (postpartum period). Your health care provider may also give you more specific instructions. If you have problems or questions, contact your health care provider. Follow these instructions at home: Vaginal bleeding  It is  normal to have vaginal bleeding (lochia) after delivery. Wear a sanitary pad for vaginal bleeding and discharge. ? During the first week after delivery, the amount and appearance of lochia is often similar to a menstrual period. ? Over the next few weeks, it will gradually decrease to a dry, yellow-brown discharge. ? For most women, lochia stops completely by 4-6 weeks after delivery. Vaginal bleeding can vary from woman to woman.  Change your sanitary pads frequently. Watch for any changes in your flow, such as: ? A sudden increase in volume. ? A change in color. ? Large blood clots.  If you pass a blood clot from your vagina, save it and call your health care provider to discuss. Do not flush blood clots down the toilet before talking with your health care provider.  Do not use tampons or douches until your health care provider says this is safe.  If you are not breastfeeding, your period should return 6-8 weeks after delivery. If you are feeding your child breast milk only (exclusive breastfeeding), your  period may not return until you stop breastfeeding. Perineal care  Keep the area between the vagina and the anus (perineum) clean and dry as told by your health care provider. Use medicated pads and pain-relieving sprays and creams as directed.  If you had a cut in the perineum (episiotomy) or a tear in the vagina, check the area for signs of infection until you are healed. Check for: ? More redness, swelling, or pain. ? Fluid or blood coming from the cut or tear. ? Warmth. ? Pus or a bad smell.  You may be given a squirt bottle to use instead of wiping to clean the perineum area after you go to the bathroom. As you start healing, you may use the squirt bottle before wiping yourself. Make sure to wipe gently.  To relieve pain caused by an episiotomy, a tear in the vagina, or swollen veins in the anus (hemorrhoids), try taking a warm sitz bath 2-3 times a day. A sitz bath is a warm water  bath that is taken while you are sitting down. The water should only come up to your hips and should cover your buttocks. Breast care  Within the first few days after delivery, your breasts may feel heavy, full, and uncomfortable (breast engorgement). Milk may also leak from your breasts. Your health care provider can suggest ways to help relieve the discomfort. Breast engorgement should go away within a few days.  If you are breastfeeding: ? Wear a bra that supports your breasts and fits you well. ? Keep your nipples clean and dry. Apply creams and ointments as told by your health care provider. ? You may need to use breast pads to absorb milk that leaks from your breasts. ? You may have uterine contractions every time you breastfeed for up to several weeks after delivery. Uterine contractions help your uterus return to its normal size. ? If you have any problems with breastfeeding, work with your health care provider or Ad[employer].  If you are not breastfeeding: ? Avoid touching your breasts a lot. Doing this can make your breasts produce more milk. ? Wear a good-fitting bra and use cold packs to help with swelling. ? Do not squeeze out (express) milk. This causes you to make more milk. Intimacy and sexuality  Ask your health care provider when you can engage in sexual activity. This may depend on: ? Your risk of infection. ? How fast you are healing. ? Your comfort and desire to engage in sexual activity.  You are able to get pregnant after delivery, even if you have not had your period. If desired, talk with your health care provider about methods of birth control (contraception). Medicines  Take over-the-counter and prescription medicines only as told by your health care provider.  If you were prescribed an antibiotic medicine, take it as told by your health care provider. Do not stop taking the antibiotic even if you start to feel better. Activity  Gradually return to  your normal activities as told by your health care provider. Ask your health care provider what activities are safe for you.  Rest as much as possible. Try to rest or take a nap while your baby is sleeping. Eating and drinking   Drink enough fluid to keep your urine pale yellow.  Eat high-fiber foods every day. These may help prevent or relieve constipation. High-fiber foods include: ? Whole grain cereals and breads. ? Brown rice. ? Beans. ? Fresh fruits and vegetables.  Do not  try to lose weight quickly by cutting back on calories.  Take your prenatal vitamins until your postpartum checkup or until your health care provider tells you it is okay to stop. Lifestyle  Do not use any products that contain nicotine or tobacco, such as cigarettes and e-cigarettes. If you need help quitting, ask your health care provider.  Do not drink alcohol, especially if you are breastfeeding. General instructions  Keep all follow-up visits for you and your baby as told by your health care provider. Most women visit their health care provider for a postpartum checkup within the first 3-6 weeks after delivery. Contact a health care provider if:  You feel unable to cope with the changes that your child brings to your life, and these feelings do not go away.  You feel unusually sad or worried.  Your breasts become red, painful, or hard.  You have a fever.  You have trouble holding urine or keeping urine from leaking.  You have little or no interest in activities you used to enjoy.  You have not breastfed at all and you have not had a menstrual period for 12 weeks after delivery.  You have stopped breastfeeding and you have not had a menstrual period for 12 weeks after you stopped breastfeeding.  You have questions about caring for yourself or your baby.  You pass a blood clot from your vagina. Get help right away if:  You have chest pain.  You have difficulty breathing.  You have sudden,  severe leg pain.  You have severe pain or cramping in your lower abdomen.  You bleed from your vagina so much that you fill more than one sanitary pad in one hour. Bleeding should not be heavier than your heaviest period.  You develop a severe headache.  You faint.  You have blurred vision or spots in your vision.  You have bad-smelling vaginal discharge.  You have thoughts about hurting yourself or your baby. If you ever feel like you may hurt yourself or others, or have thoughts about taking your own life, get help right away. You can go to the nearest emergency department or call:  Your local emergency services (911 in the [place]  A suicide crisis helpline, such as the National Suicide Prevention Lifeline at 1-800-273-8255. This is open 24 hours a day. Summary  The period of time right after you deliver your newborn up to 6-12 weeks after delivery is called the postpartum period.  Gradually return to your normal activities as told by your health care provider.  Keep all follow-up visits for you and your baby as told by your health care provider. This information is not intended to replace advice given to you by your health care provider. Make sure you discuss any questions you have with your health care provider. Document Revised: 06/25/2017 Document Reviewed: 04/05/2017 Elsevier Patient Education  2020 [employer]  

## 2020-01-15 ENCOUNTER — Encounter: Payer: Self-pay | Admitting: General Practice

## 2020-01-16 ENCOUNTER — Encounter: Payer: Medicaid Other | Admitting: Medical

## 2020-01-16 ENCOUNTER — Ambulatory Visit: Payer: Medicaid Other

## 2020-01-16 ENCOUNTER — Encounter: Payer: Self-pay | Admitting: Family Medicine

## 2020-01-23 ENCOUNTER — Encounter: Payer: Medicaid Other | Admitting: Medical

## 2020-02-02 ENCOUNTER — Emergency Department (HOSPITAL_COMMUNITY)
Admission: EM | Admit: 2020-02-02 | Discharge: 2020-02-03 | Disposition: A | Discharge status: Home / Self Care | Payer: Medicaid Other | Attending: Emergency Medicine | Admitting: Emergency Medicine

## 2020-02-02 ENCOUNTER — Emergency Department (HOSPITAL_COMMUNITY): Payer: Medicaid Other

## 2020-02-02 ENCOUNTER — Encounter (HOSPITAL_COMMUNITY): Payer: Self-pay | Admitting: Emergency Medicine

## 2020-02-02 DIAGNOSIS — Z5321 Procedure and treatment not carried out due to patient leaving prior to being seen by health care provider: Secondary | ICD-10-CM | POA: Insufficient documentation

## 2020-02-02 DIAGNOSIS — R0602 Shortness of breath: Secondary | ICD-10-CM | POA: Diagnosis not present

## 2020-02-02 DIAGNOSIS — R079 Chest pain, unspecified: Secondary | ICD-10-CM | POA: Diagnosis not present

## 2020-02-02 LAB — CBC
HCT: 37.3 % (ref 36.0–46.0)
Hemoglobin: 10.4 g/dL — ABNORMAL LOW (ref 12.0–15.0)
MCH: 20 pg — ABNORMAL LOW (ref 26.0–34.0)
MCHC: 27.9 g/dL — ABNORMAL LOW (ref 30.0–36.0)
MCV: 71.9 fL — ABNORMAL LOW (ref 80.0–100.0)
Platelets: 446 10*3/uL — ABNORMAL HIGH (ref 150–400)
RBC: 5.19 MIL/uL — ABNORMAL HIGH (ref 3.87–5.11)
RDW: 18.6 % — ABNORMAL HIGH (ref 11.5–15.5)
WBC: 8.6 10*3/uL (ref 4.0–10.5)
nRBC: 0 % (ref 0.0–0.2)

## 2020-02-02 LAB — BASIC METABOLIC PANEL
Anion gap: 13 (ref 5–15)
BUN: 8 mg/dL (ref 6–20)
CO2: 21 mmol/L — ABNORMAL LOW (ref 22–32)
Calcium: 9.4 mg/dL (ref 8.9–10.3)
Chloride: 104 mmol/L (ref 98–111)
Creatinine, Ser: 0.7 mg/dL (ref 0.44–1.00)
GFR calc Af Amer: >60 mL/min (ref >60)
GFR calc non Af Amer: >60 mL/min (ref >60)
Glucose, Bld: 95 mg/dL (ref 70–99)
Potassium: 3.5 mmol/L (ref 3.5–5.1)
Sodium: 138 mmol/L (ref 135–145)

## 2020-02-02 LAB — I-STAT BETA HCG BLOOD, ED (MC, WL, AP ONLY): I-stat hCG, quantitative: <5 m[IU]/mL (ref <5)

## 2020-02-02 LAB — TROPONIN I (HIGH SENSITIVITY): Troponin I (High Sensitivity): 3 ng/L (ref <18)

## 2020-02-02 MED ORDER — SODIUM CHLORIDE 0.9% FLUSH: 3.0000 mL | Freq: Once | INTRAVENOUS | Status: DC

## 2020-02-02 NOTE — ED Triage Notes (Signed)
Patient presents in tears, states she has had CP/SOB X2 days. Appears highly anxious in triage.

## 2020-02-03 LAB — TROPONIN I (HIGH SENSITIVITY): Troponin I (High Sensitivity): <2 ng/L (ref <18)

## 2020-02-03 NOTE — ED Notes (Signed)
Pt did not want to wait and left. 

## 2020-02-06 ENCOUNTER — Ambulatory Visit: Payer: Medicaid Other | Admitting: Student

## 2020-02-06 ENCOUNTER — Encounter: Payer: Self-pay | Admitting: Student

## 2020-04-04 DIAGNOSIS — Z20822 Contact with and (suspected) exposure to covid-19: Secondary | ICD-10-CM | POA: Diagnosis not present

## 2022-04-26 IMAGING — CR DG CHEST 2V
2 series · 2 of 2 positions shown · non-contrast
Comparison: None.

CLINICAL DATA: Shortness of breath

EXAM:
CHEST - 2 VIEW

[w chest pa]
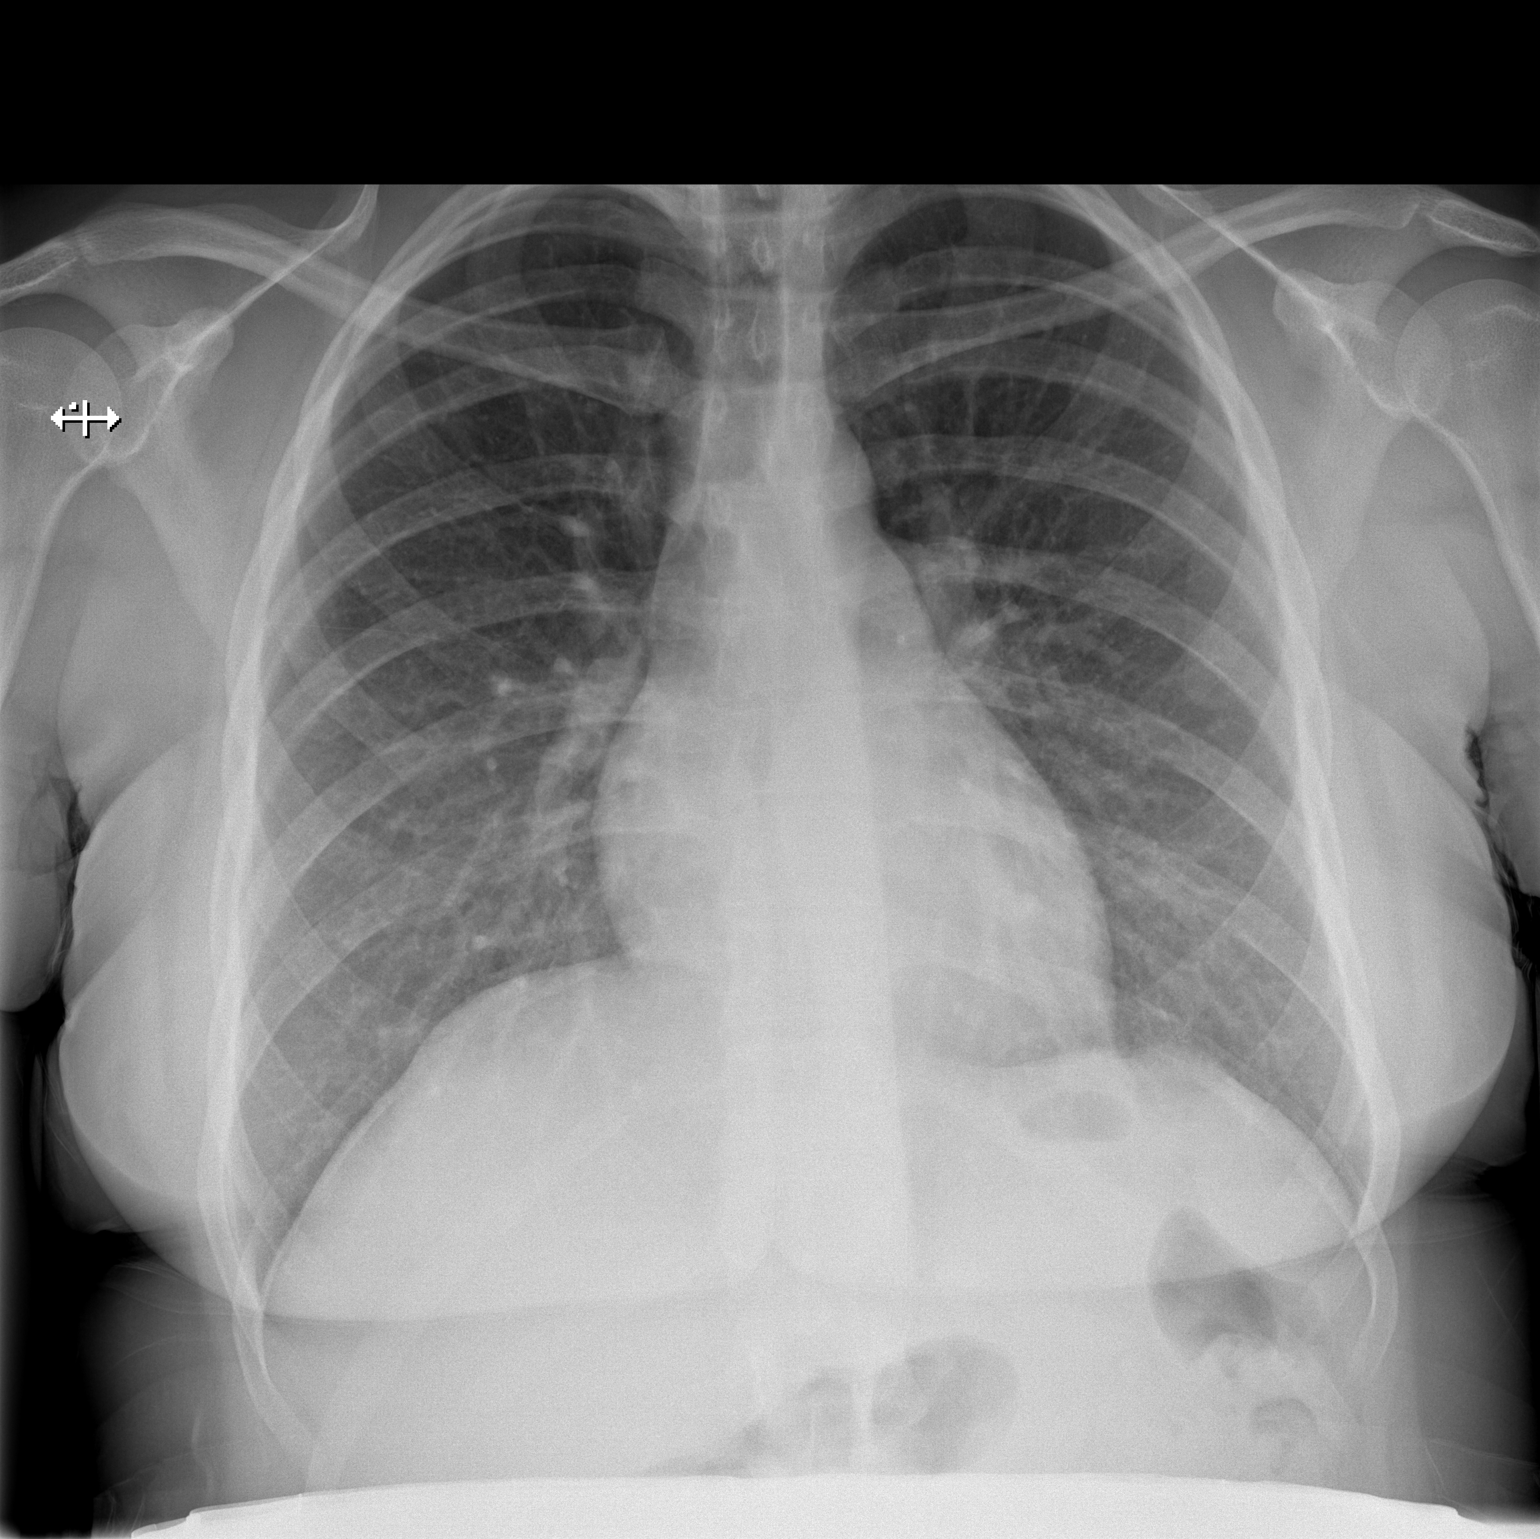

[w chest lat]
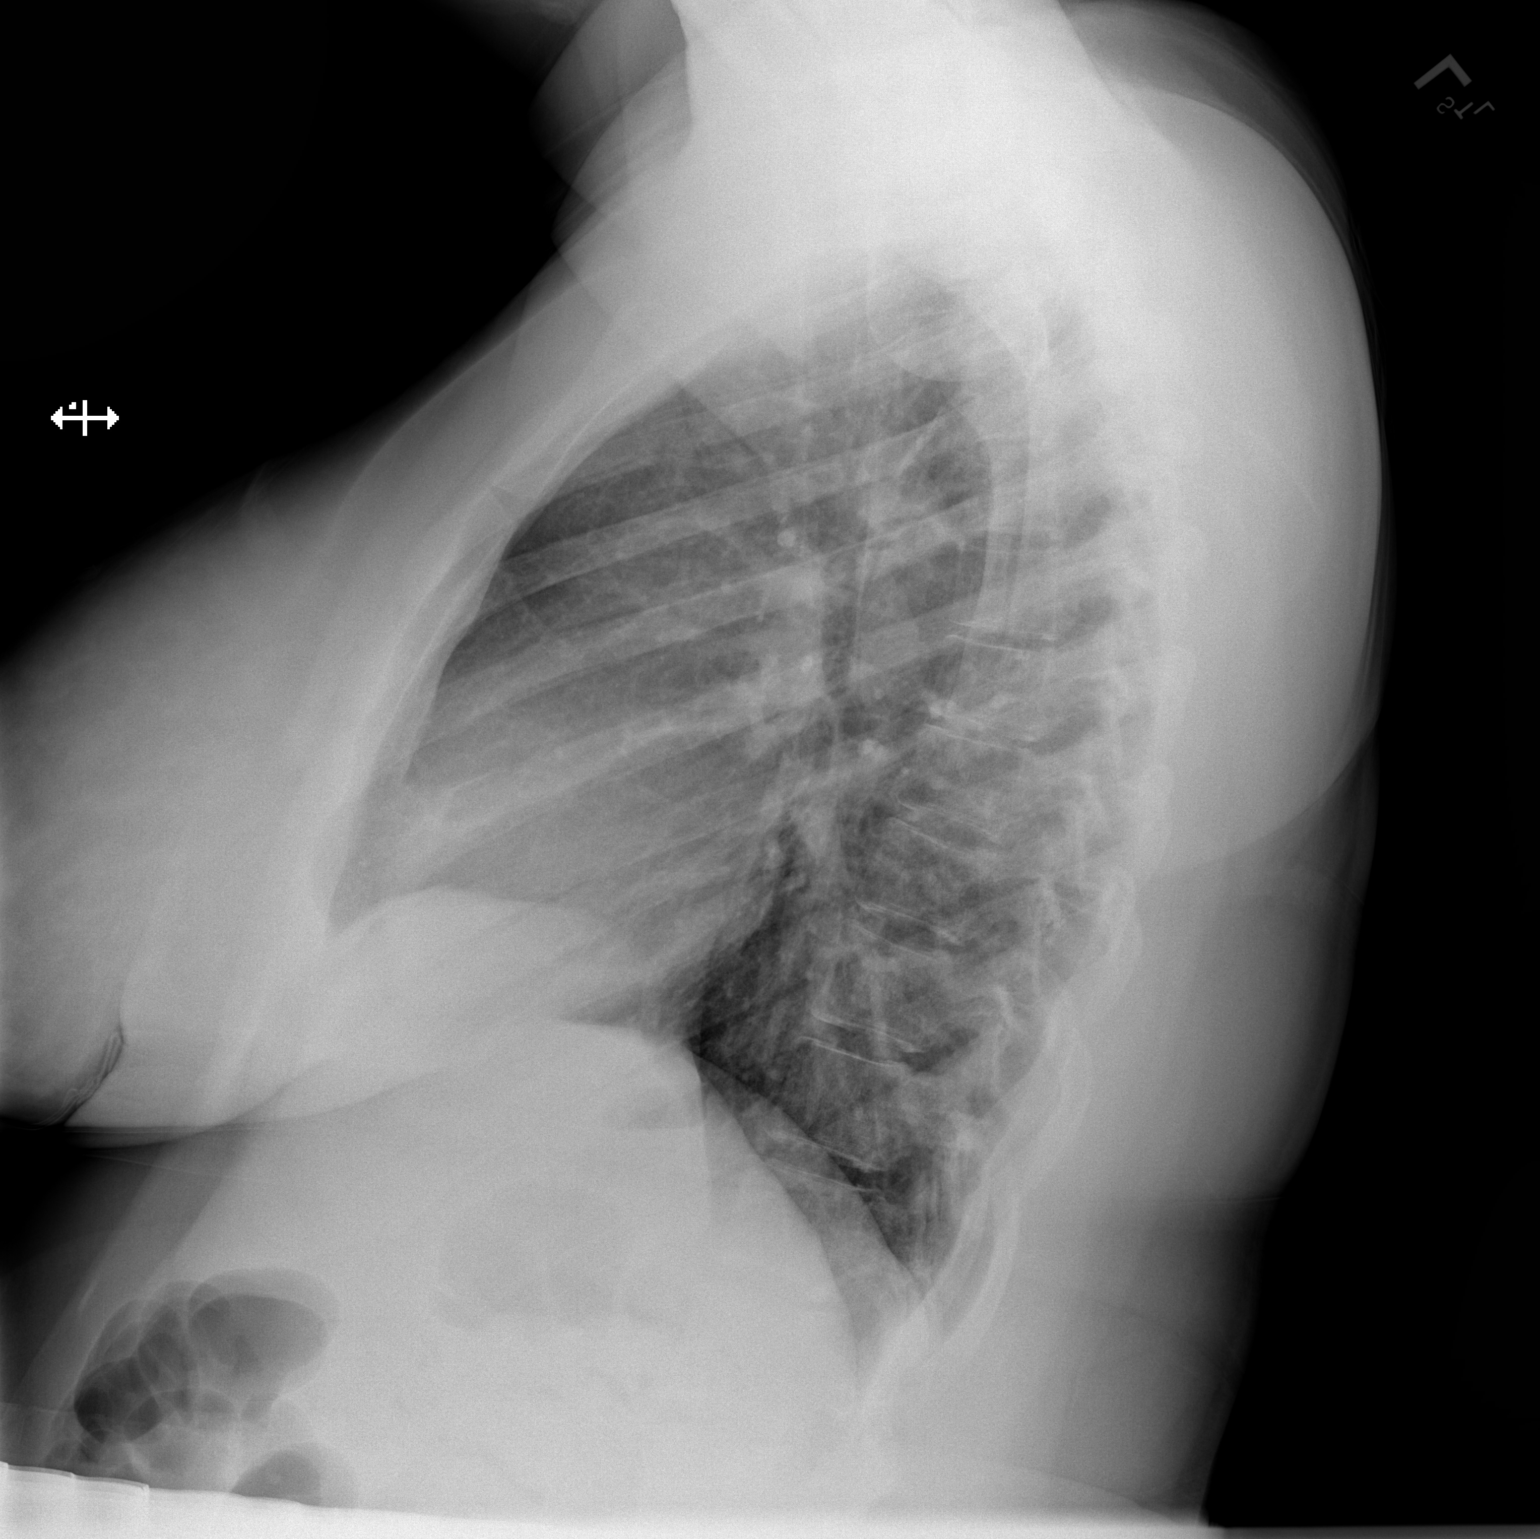

[2 of 2 positions shown; findings below may reference images not displayed]

FINDINGS: The heart size and mediastinal contours are within normal limits.
Both lungs are clear. The visualized skeletal structures are
unremarkable.
IMPRESSION: No acute abnormality of the lungs.
